# Patient Record
Sex: Male | Born: 1940 | Race: White | Hispanic: No | Smoking: Former smoker
Health system: Southern US, Community
[De-identification: ages and names within clinical notes are randomized; demographics above are authoritative.]

## PROBLEM LIST (undated history)

## (undated) DIAGNOSIS — K219 Gastro-esophageal reflux disease without esophagitis: Secondary | ICD-10-CM

## (undated) DIAGNOSIS — K5792 Diverticulitis of intestine, part unspecified, without perforation or abscess without bleeding: Secondary | ICD-10-CM

## (undated) DIAGNOSIS — K859 Acute pancreatitis without necrosis or infection, unspecified: Secondary | ICD-10-CM

## (undated) DIAGNOSIS — K449 Diaphragmatic hernia without obstruction or gangrene: Secondary | ICD-10-CM

## (undated) DIAGNOSIS — I10 Essential (primary) hypertension: Secondary | ICD-10-CM

## (undated) DIAGNOSIS — E785 Hyperlipidemia, unspecified: Secondary | ICD-10-CM

## (undated) DIAGNOSIS — N400 Enlarged prostate without lower urinary tract symptoms: Secondary | ICD-10-CM

## (undated) DIAGNOSIS — T7840XA Allergy, unspecified, initial encounter: Secondary | ICD-10-CM

## (undated) DIAGNOSIS — M1990 Unspecified osteoarthritis, unspecified site: Secondary | ICD-10-CM

## (undated) HISTORY — DX: Diverticulitis of intestine, part unspecified, without perforation or abscess without bleeding: K57.92

## (undated) HISTORY — DX: Essential (primary) hypertension: I10

## (undated) HISTORY — PX: COLONOSCOPY: SHX174

## (undated) HISTORY — PX: TRANSURETHRAL RESECTION OF PROSTATE: SHX73

## (undated) HISTORY — DX: Gastro-esophageal reflux disease without esophagitis: K21.9

## (undated) HISTORY — DX: Allergy, unspecified, initial encounter: T78.40XA

## (undated) HISTORY — DX: Benign prostatic hyperplasia without lower urinary tract symptoms: N40.0

## (undated) HISTORY — DX: Acute pancreatitis without necrosis or infection, unspecified: K85.90

## (undated) HISTORY — DX: Hyperlipidemia, unspecified: E78.5

## (undated) HISTORY — PX: PROSTATE SURGERY: SHX751

---

## 1998-09-13 ENCOUNTER — Emergency Department (HOSPITAL_COMMUNITY): Admission: EM | Admit: 1998-09-13 | Discharge: 1998-09-13 | Payer: Self-pay | Admitting: Emergency Medicine

## 1998-09-13 ENCOUNTER — Encounter: Payer: Self-pay | Admitting: Pulmonary Disease

## 1998-09-18 ENCOUNTER — Emergency Department (HOSPITAL_COMMUNITY): Admission: EM | Admit: 1998-09-18 | Discharge: 1998-09-18 | Payer: Self-pay | Admitting: Emergency Medicine

## 1998-09-26 ENCOUNTER — Encounter (INDEPENDENT_AMBULATORY_CARE_PROVIDER_SITE_OTHER): Payer: Self-pay | Admitting: Specialist

## 1998-09-26 ENCOUNTER — Inpatient Hospital Stay (HOSPITAL_COMMUNITY): Admission: RE | Admit: 1998-09-26 | Discharge: 1998-09-28 | Payer: Self-pay | Admitting: Urology

## 2000-04-23 ENCOUNTER — Ambulatory Visit (HOSPITAL_COMMUNITY): Admission: RE | Admit: 2000-04-23 | Discharge: 2000-04-23 | Payer: Self-pay | Admitting: Gastroenterology

## 2000-04-23 ENCOUNTER — Encounter (INDEPENDENT_AMBULATORY_CARE_PROVIDER_SITE_OTHER): Payer: Self-pay | Admitting: Specialist

## 2000-04-23 DIAGNOSIS — K210 Gastro-esophageal reflux disease with esophagitis: Secondary | ICD-10-CM

## 2004-07-27 ENCOUNTER — Ambulatory Visit: Payer: Self-pay | Admitting: Pulmonary Disease

## 2004-08-03 ENCOUNTER — Ambulatory Visit: Payer: Self-pay

## 2004-08-13 ENCOUNTER — Ambulatory Visit: Payer: Self-pay | Admitting: Pulmonary Disease

## 2005-08-23 ENCOUNTER — Ambulatory Visit: Payer: Self-pay | Admitting: Pulmonary Disease

## 2006-09-12 ENCOUNTER — Ambulatory Visit: Payer: Self-pay | Admitting: Pulmonary Disease

## 2006-11-14 ENCOUNTER — Ambulatory Visit: Payer: Self-pay | Admitting: Pulmonary Disease

## 2006-11-14 LAB — CONVERTED CEMR LAB
ALT: 27 units/L (ref 0–53)
AST: 29 units/L (ref 0–37)
Alkaline Phosphatase: 76 units/L (ref 39–117)
BUN: 14 mg/dL (ref 6–23)
Basophils Relative: 0.5 % (ref 0.0–1.0)
CO2: 31 meq/L (ref 19–32)
Calcium: 8.9 mg/dL (ref 8.4–10.5)
Creatinine, Ser: 0.9 mg/dL (ref 0.4–1.5)
HDL: 50.1 mg/dL (ref >39.0)
Hemoglobin: 14.7 g/dL (ref 13.0–17.0)
LDL Cholesterol: 87 mg/dL (ref 0–99)
Monocytes Absolute: 0.6 10*3/uL (ref 0.2–0.7)
Monocytes Relative: 9.8 % (ref 3.0–11.0)
Platelets: 209 10*3/uL (ref 150–400)
RDW: 12.1 % (ref 11.5–14.6)
Total Bilirubin: 0.9 mg/dL (ref 0.3–1.2)
Total Protein: 6.8 g/dL (ref 6.0–8.3)
VLDL: 14 mg/dL (ref 0–40)

## 2006-11-24 ENCOUNTER — Encounter: Payer: Self-pay | Admitting: Pulmonary Disease

## 2006-11-24 DIAGNOSIS — J309 Allergic rhinitis, unspecified: Secondary | ICD-10-CM | POA: Insufficient documentation

## 2006-11-24 DIAGNOSIS — M1990 Unspecified osteoarthritis, unspecified site: Secondary | ICD-10-CM | POA: Insufficient documentation

## 2006-11-24 DIAGNOSIS — E7800 Pure hypercholesterolemia, unspecified: Secondary | ICD-10-CM | POA: Insufficient documentation

## 2006-11-24 DIAGNOSIS — K449 Diaphragmatic hernia without obstruction or gangrene: Secondary | ICD-10-CM | POA: Insufficient documentation

## 2006-11-24 DIAGNOSIS — K5730 Diverticulosis of large intestine without perforation or abscess without bleeding: Secondary | ICD-10-CM | POA: Insufficient documentation

## 2006-11-24 DIAGNOSIS — Z87898 Personal history of other specified conditions: Secondary | ICD-10-CM | POA: Insufficient documentation

## 2006-11-24 DIAGNOSIS — D126 Benign neoplasm of colon, unspecified: Secondary | ICD-10-CM

## 2006-12-05 ENCOUNTER — Telehealth (INDEPENDENT_AMBULATORY_CARE_PROVIDER_SITE_OTHER): Payer: Self-pay | Admitting: *Deleted

## 2007-02-10 ENCOUNTER — Ambulatory Visit: Payer: Self-pay | Admitting: Pulmonary Disease

## 2007-02-10 DIAGNOSIS — R42 Dizziness and giddiness: Secondary | ICD-10-CM

## 2007-04-01 ENCOUNTER — Encounter: Payer: Self-pay | Admitting: Pulmonary Disease

## 2007-06-18 ENCOUNTER — Inpatient Hospital Stay (HOSPITAL_COMMUNITY): Admission: EM | Admit: 2007-06-18 | Discharge: 2007-06-25 | Payer: Self-pay | Admitting: Emergency Medicine

## 2007-06-18 ENCOUNTER — Ambulatory Visit: Payer: Self-pay | Admitting: Pulmonary Disease

## 2007-06-24 ENCOUNTER — Ambulatory Visit: Payer: Self-pay | Admitting: Internal Medicine

## 2007-07-03 ENCOUNTER — Encounter: Payer: Self-pay | Admitting: Pulmonary Disease

## 2007-07-06 ENCOUNTER — Ambulatory Visit: Payer: Self-pay | Admitting: Gastroenterology

## 2007-07-07 ENCOUNTER — Ambulatory Visit: Payer: Self-pay | Admitting: Gastroenterology

## 2007-07-16 ENCOUNTER — Telehealth: Payer: Self-pay | Admitting: Gastroenterology

## 2007-07-17 ENCOUNTER — Ambulatory Visit (HOSPITAL_COMMUNITY): Admission: RE | Admit: 2007-07-17 | Discharge: 2007-07-17 | Payer: Self-pay | Admitting: Gastroenterology

## 2007-08-03 ENCOUNTER — Ambulatory Visit: Payer: Self-pay | Admitting: Gastroenterology

## 2007-08-03 DIAGNOSIS — R933 Abnormal findings on diagnostic imaging of other parts of digestive tract: Secondary | ICD-10-CM | POA: Insufficient documentation

## 2007-10-06 ENCOUNTER — Telehealth: Payer: Self-pay | Admitting: Gastroenterology

## 2007-10-23 ENCOUNTER — Ambulatory Visit (HOSPITAL_COMMUNITY): Admission: RE | Admit: 2007-10-23 | Discharge: 2007-10-23 | Payer: Self-pay | Admitting: Gastroenterology

## 2007-10-23 ENCOUNTER — Encounter: Payer: Self-pay | Admitting: Gastroenterology

## 2007-11-06 ENCOUNTER — Encounter: Payer: Self-pay | Admitting: Pulmonary Disease

## 2007-12-10 ENCOUNTER — Ambulatory Visit: Payer: Self-pay | Admitting: Pulmonary Disease

## 2008-04-04 ENCOUNTER — Encounter: Payer: Self-pay | Admitting: Gastroenterology

## 2008-04-13 ENCOUNTER — Encounter: Payer: Self-pay | Admitting: Pulmonary Disease

## 2008-09-14 ENCOUNTER — Encounter: Payer: Self-pay | Admitting: Pulmonary Disease

## 2008-10-05 ENCOUNTER — Ambulatory Visit: Payer: Self-pay | Admitting: Pulmonary Disease

## 2009-03-15 ENCOUNTER — Telehealth (INDEPENDENT_AMBULATORY_CARE_PROVIDER_SITE_OTHER): Payer: Self-pay | Admitting: *Deleted

## 2009-03-22 ENCOUNTER — Encounter: Payer: Self-pay | Admitting: Pulmonary Disease

## 2009-04-07 ENCOUNTER — Telehealth (INDEPENDENT_AMBULATORY_CARE_PROVIDER_SITE_OTHER): Payer: Self-pay | Admitting: *Deleted

## 2009-04-12 ENCOUNTER — Encounter: Payer: Self-pay | Admitting: Pulmonary Disease

## 2009-06-28 ENCOUNTER — Ambulatory Visit: Payer: Self-pay | Admitting: Pulmonary Disease

## 2009-06-28 ENCOUNTER — Ambulatory Visit: Payer: Self-pay | Admitting: Internal Medicine

## 2009-06-28 DIAGNOSIS — R9431 Abnormal electrocardiogram [ECG] [EKG]: Secondary | ICD-10-CM

## 2009-06-29 DIAGNOSIS — M5030 Other cervical disc degeneration, unspecified cervical region: Secondary | ICD-10-CM | POA: Insufficient documentation

## 2009-06-29 DIAGNOSIS — K859 Acute pancreatitis without necrosis or infection, unspecified: Secondary | ICD-10-CM | POA: Insufficient documentation

## 2009-06-29 DIAGNOSIS — R972 Elevated prostate specific antigen [PSA]: Secondary | ICD-10-CM | POA: Insufficient documentation

## 2009-06-29 LAB — CONVERTED CEMR LAB
Albumin: 4.4 g/dL (ref 3.5–5.2)
Basophils Absolute: 0 10*3/uL (ref 0.0–0.1)
CO2: 27 meq/L (ref 19–32)
Calcium: 9.2 mg/dL (ref 8.4–10.5)
Creatinine, Ser: 0.9 mg/dL (ref 0.4–1.5)
Glucose, Bld: 108 mg/dL — ABNORMAL HIGH (ref 70–99)
Hemoglobin: 14.7 g/dL (ref 13.0–17.0)
Lymphocytes Relative: 30.5 % (ref 12.0–46.0)
Monocytes Relative: 9.1 % (ref 3.0–12.0)
Neutro Abs: 3.6 10*3/uL (ref 1.4–7.7)
Neutrophils Relative %: 58.6 % (ref 43.0–77.0)
PSA: 6.3 ng/mL — ABNORMAL HIGH (ref 0.10–4.00)
RBC: 4.82 M/uL (ref 4.22–5.81)
RDW: 12.9 % (ref 11.5–14.6)
Total Protein: 7.7 g/dL (ref 6.0–8.3)

## 2009-08-08 ENCOUNTER — Telehealth (INDEPENDENT_AMBULATORY_CARE_PROVIDER_SITE_OTHER): Payer: Self-pay | Admitting: *Deleted

## 2009-08-16 ENCOUNTER — Encounter: Payer: Self-pay | Admitting: Pulmonary Disease

## 2009-10-16 ENCOUNTER — Ambulatory Visit: Payer: Self-pay | Admitting: Pulmonary Disease

## 2009-10-28 ENCOUNTER — Emergency Department (HOSPITAL_BASED_OUTPATIENT_CLINIC_OR_DEPARTMENT_OTHER): Admission: EM | Admit: 2009-10-28 | Discharge: 2009-10-28 | Payer: Self-pay | Admitting: Emergency Medicine

## 2009-12-05 ENCOUNTER — Encounter: Payer: Self-pay | Admitting: Gastroenterology

## 2010-02-06 NOTE — Letter (Signed)
Summary: Screening/[facility]  Screening/[facility]   Imported By: Lester Trousdale 07/05/2009 08:32:30  _____________________________________________________________________  External Attachment:    Type:   Image     Comment:   External Document

## 2010-02-06 NOTE — Medication Information (Signed)
Summary: Refills for Omeprazole/[facility]  Refills for Omeprazole/[facility]   Imported By: Sherian Rein 12/11/2009 12:09:50  _____________________________________________________________________  External Attachment:    Type:   Image     Comment:   External Document

## 2010-02-06 NOTE — Progress Notes (Signed)
Summary: Records request from [employer]  Request for records received from [employer]. Request forwarded to [employer]. Dena Chavis  April 07, 2009 9:23 AM

## 2010-02-06 NOTE — Letter (Signed)
Summary: [facility]  [facility]   Imported By: Sherian Rein 08/31/2009 09:16:31  _____________________________________________________________________  External Attachment:    Type:   Image     Comment:   External Document

## 2010-02-06 NOTE — Letter (Signed)
Summary: [facility]  [facility]   Imported By: Lennie Odor 05/02/2009 11:25:06  _____________________________________________________________________  External Attachment:    Type:   Image     Comment:   External Document

## 2010-02-06 NOTE — Progress Notes (Signed)
Summary: Records request from [facility]  Request for records received from [employer].Request forwarded to [facility]. Dena Chavis  March 15, 2009 10:44 AM

## 2010-02-06 NOTE — Assessment & Plan Note (Signed)
Summary: flu shot/ mbw  Nurse Visit   Allergies: 1)  ! Percocet  Orders Added: 1)  Flu Vaccine 17yrs + MEDICARE PATIENTS [Q2039] 2)  Administration Flu vaccine - MCR [G0008] Flu Vaccine Consent Questions     Do you have a history of severe allergic reactions to this vaccine? no    Any prior history of allergic reactions to egg and/or gelatin? no    Do you have a sensitivity to the preservative Thimersol? no    Do you have a past history of Guillan-Barre Syndrome? no    Do you currently have an acute febrile illness? no    Have you ever had a severe reaction to latex? no    Vaccine information given and explained to patient? yes    Are you currently pregnant? no    Lot Number:[id number]   Exp Date:07/07/2010   Site Given  Left Deltoid [id number]    Tammy Scott  November 02, 2009 4:51 PM

## 2010-02-06 NOTE — Assessment & Plan Note (Signed)
Summary: PHYSICAL ///KP   Primary Care Provider:   Alroy Dust MD  CC:  last ov 2008---cpx--discuss psa level for life insurance.  History of Present Illness: 70 y/o WM here for a follow up visit & CPX...     ~  June 28, 2009:  I last saw Thomas Pearson 9/08 for a yearly ROV w/ mult medical problems as listed below & hx HH/ GERD on Nexium, and Hypercholesterolemia on Simva80... he was hospitalized by GI 6/09 w/ idiopathic pancreatitis> Abd Sonar & CT Abd showed inflamm/ edematous changes around the duod, pancreas, & retroperitoneum (there was a duod divertic, GB was OK)... he improved w/ conservative management;  outpt EGD by [facility] 6/09 showed 2cmHH & stricture in distal esoph...  MR Abd 7/09 showed improved pancreatitis/ resolved edema, +duod divertic, norm GB & ducts...  no recurrent problem since then- he knows to avoid alcohol...  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 he uses Tylenol for pain...  he had labs at insurance co 3/11 trying to get add'l life insurance & FLP looked poor off his Simva, and PSA was elevated at 5.52> so he set up this f/u appt.   Current Problems:   ALLERGIC RHINITIS (ICD-477.9) - he uses OTC antihist Prn + Nasal Saline...  ABNORMAL ELECTROCARDIOGRAM (ICD-794.31) - he he transient AFib reported in the ER w/ his acute pancreatitis 6/09> self limited & ret to NSR... otherw EKG's have been normal x PAC's noted intermittently & he is asymptomatic- denies CP, palpit, dizzy, etc...  ~  baseline EKGs showed NSR, WNL...  ~  Myoview 7/06 showed occas PAC/ PVC, hypertensive response, good exerc tolerance, diaph attenuation w/o definte scar, no ischemia...  ~  EKG 6/11 showed NSR w/ PAC, otherw WNL/ NAD...  HYPERCHOLESTEROLEMIA (ICD-272.0) - hx hypercholesterolemia prev on Simva80 but this was stopped 6/09 hosp for pancreatitis & pt never followed up... rec to start  CRESTOR 20mg /d + diet Rx...  ~  FLP 3/04 on diet alone showed TChol 304, TG 130, HDL 53, LDL 225... rec start Simva40.  ~  FLP 9/04 on Simva40 showed TChol 205, Tg 103, HDL 50, LDL 135... rec incr to Simva80.  ~  FLP 7/06 on diet alone showed TChol 283, TG 111, HDL 48, LDL 209... rec restart Simva80.  ~  FLP 11/08 on Simva80 showed TChol 151, TG 68, HDL 50, LDL 87  ~  FLP 3/11 by insurance co on diet alone showed TChol 263, TG 118, HDL 61, LDL 178  ~  6/11:  rec to start CRESTOR 20mg /d + low chol/ low fat diet...  HIATAL HERNIA (ICD-553.3) & ESOPHAGITIS, REFLUX (ICD-530.11) - on OMEPRAZOLE 20mg /d regularly...   ~  EGD 5/05 by [facility] showed HH, gastritis, & treated w/ Nexium 40mg /d...  ~  EGD 6/09 by DrKaplan showed 2cmHH, & esoph stricture...  DIVERTICULOSIS, COLON (ICD-562.10), & COLONIC POLYPS (ICD-211.3)  ~  colonoscopy 5/05 by [facility] showed divertics, diminutive colon polyps, hems...  Hx of ACUTE PANCREATITIS (ICD-577.0) - see 6/09 hospitalization and f/u by DrKaplan...  BENIGN PROSTATIC HYPERTROPHY, HX OF (ICD-V13.8), & ELEVATED PROSTATE SPECIFIC ANTIGEN (ICD-790.93) - long hx elevated PSA's w/ TURP 9/00 by [id number]- benign tissue... follow up yearly PSA's in the 4-5 range and prostate bx 9/06 was also benign...   ~  labs 3/11 by insurance co showed PSA= 5.52  ~  labs 6/11 here showed  PSA= 6.30 & we [facility]...  DEGENERATIVE JOINT DISEASE (ICD-715.90), & DEGENERATIVE DISC DISEASE, CERVICAL SPINE (ICD-722.4) - followed by DrDeveshwar for Rheum w/ signif DJD knees- s/p Synvisc shots & he's been told he'll need TKR's...  also hx chronic neck pain & DDD> he uses Tylenol, heat, etc...   Preventive Screening-Counseling & Management  Alcohol-Tobacco     Alcohol drinks/day: 0     Smoking Status: quit     Year Quit: 1967  Allergies: 1)  ! Percocet  Comments:  Nurse/Medical Assistant: The patient's medications and allergies were reviewed with  the patient and were updated in the Medication and Allergy Lists.  Past History:  Past Medical History: ALLERGIC RHINITIS (ICD-477.9) ABNORMAL ELECTROCARDIOGRAM (ICD-794.31) HYPERCHOLESTEROLEMIA (ICD-272.0) HIATAL HERNIA (ICD-553.3) ESOPHAGITIS, REFLUX (ICD-530.11) DIVERTICULOSIS, COLON (ICD-562.10) COLONIC POLYPS (ICD-211.3) Hx of ACUTE PANCREATITIS (ICD-577.0) BENIGN PROSTATIC HYPERTROPHY, HX OF (ICD-V13.8) ELEVATED PROSTATE SPECIFIC ANTIGEN (ICD-790.93) DEGENERATIVE JOINT DISEASE (ICD-715.90) DEGENERATIVE DISC DISEASE, CERVICAL SPINE (ICD-722.4)  Past Surgical History: S/P transurethral resection of prostate 9/00 & prostate bx's 9/06  Family History: Reviewed history from 08/03/2007 and no changes required. No FH of Colon Cancer Family History of Heart Disease:  Father Family History of Liver Disease/Cirrhosis: Son  Social History: Reviewed history from 08/03/2007 and no changes required. Patient is a former smoker. -stopped 41 years ago Alcohol Use - no Daily Caffeine Use 3-4 per day Illicit Drug Use - no Occupation: Retired  Review of Systems       The patient complains of joint pain, stiffness, and arthritis.  The patient denies fever, chills, sweats, anorexia, fatigue, weakness, malaise, weight loss, sleep disorder, blurring, diplopia, eye irritation, eye discharge, vision loss, eye pain, photophobia, earache, ear discharge, tinnitus, decreased hearing, nasal congestion, nosebleeds, sore throat, hoarseness, chest pain, palpitations, syncope, dyspnea on exertion, orthopnea, PND, peripheral edema, cough, dyspnea at rest, excessive sputum, hemoptysis, wheezing, pleurisy, nausea, vomiting, diarrhea, constipation, change in bowel habits, abdominal pain, melena, hematochezia, jaundice, gas/bloating, indigestion/heartburn, dysphagia, odynophagia, dysuria, hematuria, urinary frequency, urinary hesitancy, nocturia, incontinence, back pain, joint swelling, muscle cramps, muscle  weakness, sciatica, restless legs, leg pain at night, leg pain with exertion, rash, itching, dryness, suspicious lesions, paralysis, paresthesias, seizures, tremors, vertigo, transient blindness, frequent falls, frequent headaches, difficulty walking, depression, anxiety, memory loss, confusion, cold intolerance, heat intolerance, polydipsia, polyphagia, polyuria, unusual weight change, abnormal bruising, bleeding, enlarged lymph nodes, urticaria, allergic rash, hay fever, and recurrent infections.    Vital Signs:  Patient profile:   70 year old male Height:      72 inches Weight:      174.31 pounds BMI:     23.73 O2 Sat:      96 % on Room air Temp:     96.9 degrees F oral Pulse rate:   54 / minute BP sitting:   122 / 68  (left arm) Cuff size:   regular  Vitals Entered By: Randell Loop CMA (June 28, 2009 11:25 AM)  O2 Sat at Rest %:  96 O2 Flow:  Room air CC: last ov 2008---cpx--discuss psa level for life insurance Is Patient Diabetic? No Pain Assessment Patient in pain? no      Comments no changes in meds today   Physical Exam  Additional Exam:  WD, WN, 70 y/o WM in NAD... GENERAL:  Alert & oriented; pleasant & cooperative. HEENT:  [place]/AT, EOM-wnl, PERRLA, Fundi-benign, EACs-clear, TMs-wnl, NOSE-clear, THROAT-clear & wnl. NECK:  Supple w/ fairROM; no JVD; normal carotid impulses w/o  bruits; no thyromegaly or nodules palpated; no lymphadenopathy. CHEST:  Clear to P & A; without wheezes/ rales/ or rhonchi. HEART:  Regular Rhythm; without murmurs/ rubs/ or gallops detected... ABDOMEN:  Soft & nontender; normal bowel sounds; no organomegaly or masses palpated... EXT: without deformities, +arthritic changes; no varicose veins/ venous insuffic/ or edema. NEURO:  CN's intact; motor testing normal; sensory testing normal; gait normal & balance OK. DERM:  No lesions noted; no rash etc...    CXR  Procedure date:  06/28/2009  Findings:      CHEST - 2 VIEW Comparison:  06/18/2007   Findings: Small midline heart with hyperaerated lungs as before. No active disease or interval change.  Osseous structures intact.   IMPRESSION: COPD - no active disease.   Read By:  Bernerd Limbo,  M.D.   EKG  Procedure date:  06/28/2009  Findings:      Normal sinus rhythm with rate of:  60/ min... Tracing shows one PAC, otherw WNL, NAD...  SN   MISC. Report  Procedure date:  06/28/2009  Findings:      BMP (METABOL)   Sodium                    142 mEq/L                   135-145   Potassium                 4.2 mEq/L                   3.5-5.1   Chloride                  108 mEq/L                   96-112   Carbon Dioxide            27 mEq/L                    19-32   Glucose              [H]  108 mg/dL                   91-47   BUN                       19 mg/dL                    8-29   Creatinine                0.9 mg/dL                   5.6-2.1   Calcium                   9.2 mg/dL                   3.0-86.5   GFR                       92.03 mL/min                >60  Hepatic/Liver Function Panel (HEPATIC)   Total Bilirubin           1.0 mg/dL  0.3-1.2   Direct Bilirubin          0.1 mg/dL                   1.1-9.1   Alkaline Phosphatase      86 U/L                      39-117   AST                       22 U/L                      0-37   ALT                       16 U/L                      0-53   Total Protein             7.7 g/dL                    4.7-8.2   Albumin                   4.4 g/dL                    9.5-6.2  CBC Platelet w/Diff (CBCD)   White Cell Count          6.1 K/uL                    4.5-10.5   Red Cell Count            4.82 Mil/uL                 4.22-5.81   Hemoglobin                14.7 g/dL                   13.0-86.5   Hematocrit                43.1 %                      39.0-52.0   MCV                       89.4 fl                     78.0-100.0   Platelet Count            194.0 K/uL                   150.0-400.0   Neutrophil %              58.6 %                      43.0-77.0   Lymphocyte %              30.5 %                      12.0-46.0   Monocyte %                9.1 %  3.0-12.0   Eosinophils%              1.2 %                       0.0-5.0   Basophils %               0.6 %                       0.0-3.0   TSH (TSH)   FastTSH                   1.34 uIU/mL                 0.35-5.50  Prostate Specific Antigen (PSA)   PSA-Hyb              [H]  6.30    Impression & Recommendations:  Problem # 1:  PHYSICAL EXAMINATION (ICD-V70.0) He had FLP & additional labs from [facility] 03/22/09 & these were reviewed w/ pt... Orders: 12 Lead EKG (12 Lead EKG) TLB-BMP (Basic Metabolic Panel-BMET) (80048-METABOL) TLB-Hepatic/Liver Function Pnl (80076-HEPATIC) TLB-CBC Platelet - w/Differential (85025-CBCD) TLB-TSH (Thyroid Stimulating Hormone) (84443-TSH) TLB-PSA (Prostate Specific Antigen) (84153-PSA) T-2 View CXR (71020TC)  Problem # 2:  ABNORMAL ELECTROCARDIOGRAM (ICD-794.31) He had transient PAF in ER 6/09 w/ his acute pancreatitis, converted spont... otherw asymptomatic w/ rare PAC's on EKG... cards eval 7/06 for mult risk factors= neg Myoview w/o ischemia... we reviewed risks and need to modify in his favor...  Problem # 3:  HYPERCHOLESTEROLEMIA (ICD-272.0) He will start CRESTOR 20mg /d & f/u FLP in 3 months... His updated medication list for this problem includes:    Crestor 20 Mg Tabs (Rosuvastatin calcium) .[name] Kitchen... Take 1 tab by mouth once daily for cholesterol...  Problem # 4:  Hx of ACUTE PANCREATITIS (ICD-577.0) GI is stable w/o recurrent problems... continue OMEP 20mg /d...  Problem # 5:  ELEVATED PROSTATE SPECIFIC ANTIGEN (ICD-790.93) We will refer to Urology for repeat eval w/ increasing PSA...  Problem # 6:  DEGENERATIVE JOINT DISEASE (ICD-715.90) Followed by Rober Minion for Rheum...  Problem # 7:  OTHER MEDICAL PROBLEMS AS  NOTED>>>  Complete Medication List: 1)  Adult Aspirin Low Strength 81 Mg Tbdp (Aspirin) .... Take 1 tablet by mouth once a day 2)  Crestor 20 Mg Tabs (Rosuvastatin calcium) .... Take 1 tab by mouth once daily for cholesterol... 3)  Omeprazole 20 Mg Cpdr (Omeprazole) .[name] Kitchen.. 1 each day 30 minutes before meal  Other Orders: Urology Referral (Urology)  Patient Instructions: 1)  Today we updated your med list- see below.... 2)  We wrote a new perscription for CRESTOR 20mg  to take one tab daily for your cholesterol...  3)  Let's plan a f/u Lipid Profile after you have been on this med for 3-4 months (call us when you are ready to repeat the lab)... 4)  Today we did your CXR, EKG, & the rest of your needed blood work... please call the "phone tree" in a few days for your lab results.[name] KitchenMarland Kitchen 5)  We will arrange for an appt w/ the Urologist to check your prostate in light of the elevated PSA blood test... 6)  Call for any problems... Prescriptions: CRESTOR 20 MG TABS (ROSUVASTATIN CALCIUM) take 1 tab by mouth once daily for cholesterol...  #30 x 11   Entered and Authorized by:   Michele Mcalpine MD   Signed by:   Michele Mcalpine MD  on 06/28/2009   Method used:   Electronically to        [facility]* (retail)       [address] [place], [place]  [address]       Ph: [phone] or [phone]       Fax: [phone]   RxID:   [id number]    [facility]io[employer] ECG  ID: [id number] Patient: Thomas Pearson, Thomas Pearson DOB: Aug 07, 1940 Age: 70 Years Old Sex: Male Race: White Physician: Darey Hershberger Technician: Randell Loop CMA Height: 72 Weight: 174.31 Status: Unconfirmed Past Medical History:  DEGENERATIVE JOINT DISEASE (ICD-715.90) HYPERCHOLESTEROLEMIA (ICD-272.0) BENIGN PROSTATIC HYPERTROPHY, HX OF (ICD-V13.8) DIVERTICULOSIS, COLON (ICD-562.10) COLONIC POLYPS (ICD-211.3) HIATAL HERNIA (ICD-553.3) ALLERGIC RHINITIS (ICD-477.9) nausea with  vomiting generalized abdominal pain  Recorded: 06/28/2009 11:37 AM P/PR: 128 ms / 142 ms - Heart rate (maximum exercise) QRS: 96 QT/QTc/QTd: 410 ms / 408 ms / 41 ms - Heart rate (maximum exercise)  P/QRS/T axis: -47 deg / 75 deg / 64 deg - Heart rate (maximum exercise)  Heartrate: 59 bpm  Interpretation:  Normal sinus rhythm with rate of:  60/ min... Tracing shows one PAC, otherw WNL, NAD...  SN

## 2010-02-06 NOTE — Progress Notes (Signed)
Summary: psa Values  Phone Note From Other Clinic   Caller: [facility] Call For: nadel Summary of Call: need all pt's PSA values faxed asap Fax# [phone] Initial call taken by: Eugene Gavia,  August 08, 2009 10:39 AM  Follow-up for Phone Call        Faxed notes.//Juanita Follow-up by: Darletta Moll,  August 09, 2009 10:26 AM

## 2010-03-06 ENCOUNTER — Encounter: Payer: Self-pay | Admitting: Pulmonary Disease

## 2010-03-20 NOTE — Letter (Signed)
Summary: [facility]  [facility]   Imported By: Sherian Rein 03/13/2010 15:12:02  _____________________________________________________________________  External Attachment:    Type:   Image     Comment:   External Document

## 2010-05-22 NOTE — Discharge Summary (Signed)
NAME:  Thomas Pearson, Thomas Pearson NO.:  [id number]   MEDICAL RECORD NO.:  [id number]          PATIENT TYPE:  INP   LOCATION:  1420                         FACILITY[facility]  [facility]   PHYSICIAN:  Iva Boop, MD,FACGDATE OF BIRTH:  [birth year]   DATE OF ADMISSION:  06/18/2007  DATE OF DISCHARGE:  06/25/2007                               DISCHARGE SUMMARY   ADMISSION DIAGNOSES:  61. A 70 year old white male with acute pancreatitis, etiology not      clear, rule out  contained perforated duodenal ulcer versus      biliary, versus medication induced.  2. Gastroesophageal reflux disease.  3. Hyperlipidemia.  4. History of colon polyps.  5. Transient atrial fibrillation secondary to acute illness.  6. Ileus secondary to pancreatitis, resolved.   CONSULTATIONS:  Admitted to Dr. Jodelle Green service and transferred to GI  service/Dr. Leone Payor.   PROCEDURE:  CT scan abdomen and pelvis.   BRIEF HISTORY:  Dany is a pleasant 70 year old white male a primary  patient of Dr. Jodelle Green previously known to Dr. Kinnie Scales from a GI  standpoint, who presented to the emergency room on June 18, 2007 with  complaints of acute severe abdominal pain which occurred last evening,  onset after eating dinner.  In retrospect, the patient stated he had  been having some mild epigastric discomfort over the past few weeks, but  had not been having any other associated symptoms.  He is on daily PPI  for chronic GERD, he takes 1 baby aspirin per day and no NSAIDs.  Denies  any ETOH.  The patient says that onset, his pain was severe, radiated to  his back and was associated with nausea and vomiting.  He did not have  any hematemesis.  He has not had any melena.  No documented fever or  chills.  His pain persisted through this morning and he presented to the  emergency room.  Workup at this point was CT scan of the abdomen and  pelvis.  It shows evidence of acute pancreatitis and findings also  concerning for possible  penetrating duodenal ulcer.  At the time of  admission, his pain is somewhat improved.  Pain level has gone from a 10  to a 6.  He remained hemodynamically stable at this time.  He did have a  run of atrial fibrillation last evening which converted to normal sinus  rhythm and this has persisted.  He is admitted at this time for  supportive management and further diagnostic workup.   LABORATORY DATA:  On admission:  WBC of 12.5, hemoglobin 15.9,  hematocrit of 46.5, MCV of 88, platelets 198.  Serial values were  obtained on June 23, 2007:  WBC of 12.6, hemoglobin 13.1, hematocrit of  37.9, MCV of 87.6, platelets 244, sed rate was 20.  Electrolytes within  normal limits.  Glucose 188 on admission.  Follow up on June 23, 2007  was 116 and GFR greater than 60, albumin 4.1 on admission.  Liver  function studies normal.  Lipase greater than 2000, lipase on June 19, 2007 was 386, amylase 915.  By June 21, 2007 , lipase was 45.  Amylase  was 149.  Lipid panel:  Cholesterol 118, triglycerides 60, HDL 48, LDL  is 58, TSH was 1.31, PSA of 3.59.  UA showed ketones positive at 40,  protein positive at 100.   DIAGNOSTICS:  1. EKG on admission shows a normal sinus rhythm  2. CT scan of the abdomen and pelvis on June 18, 2007 shows  inflammatory/edematous changes around the proximal duodenum, pancreas  and retroperitoneum suggesting duodenal ulcer disease, rule out possible  contained perforation versus acute pancreatitis.  No drainable fluid  collection.  1. Abdominal ultrasound on June 18, 2007:  Normal appearing      gallbladder.  No ductal dilation.  Edematous changes of the      pancreatic head.  2. Chest x-ray on June 18, 2007:  No pleural effusion or pulmonary      edema.  3. Follow up CT scan on June 22, 2007 shows features consistent with      acute pancreatitis.  No evidence of pancreatic necrosis.  No      pseudocyst.  There is an apparent duodenal diverticulum near the      junction of  the first and second portions of the duodenum      containing air and contrast, but no free spill of contrast evident.      Therefore this is felt more likely to represent a diverticulum,      though contained perforation could not completely be ruled out.   HOSPITAL COURSE:  The patient was admitted to Dr. Jodelle Green service and  then cared for by GI, and transferred to the GI service with acute  pancreatitis.  He was initially kept n.p.o., placed on telemetry because  of a transient run of atrial fibrillation in the emergency room.  He was  hydrated, given antiemetics, analgesics and a kept n.p.o.  Initial CT  scan as outlined above, raising the possibility of a  contained/perforation of a duodenal ulcer.  He clearly had chemical and  radiographic evidence for acute pancreatitis.  He gradually improved  over the next few days.  He did develop an ileus which slowed his  progress and this was resolving by June 24, 2007 and his diet was  advanced.  He did not have any evidence for biliary etiology of his  pancreatitis.  He had been on Zocor at home and this was considered  possibly the culprit.  He was continued off of Zocor.   DISPOSITION:  By June 25, 2007, he was stable for discharge to home.   FOLLOW UP:  He was to follow up with Dr. Arlyce Dice in the office in 2 weeks  and at that time would likely need an MRI and plus/minus MRCP.   DISCHARGE MEDICATIONS:  1. He was to stay off of Zocor.  2. He was to continue his aspirin 81 mg daily.  3. Prilosec 20 mg daily.  4. B12 supplement weekly as previous.  5. MiraLax 17 gm daily as needed for constipation.  6. Percocet 5/325 one-two every 4-6 hours as needed for pain.   DIET:  Low fat.   CONDITION ON DISCHARGE:  Stable and improved.      Amy Esterwood, PA-C      Iva Boop, MD,FACG  Electronically Signed    AE/MEDQ  D:  07/28/2007  T:  07/28/2007  Job:  [id number]   cc:   Lonzo Cloud. Kriste Basque, MD  [address] [facility]  [place]  [place] [address]   Barbette Hair. Arlyce Dice, MD,FACG  [address]  [place]  [place] [address]

## 2010-07-25 ENCOUNTER — Other Ambulatory Visit: Payer: Self-pay | Admitting: *Deleted

## 2010-07-25 ENCOUNTER — Other Ambulatory Visit: Payer: Self-pay | Admitting: Pulmonary Disease

## 2010-07-25 MED ORDER — ROSUVASTATIN CALCIUM 20 MG PO TABS: 20.0000 mg | ORAL_TABLET | Freq: Every day | ORAL | Status: DC

## 2010-07-25 NOTE — Telephone Encounter (Signed)
1 refill sent for Crestor. Pt will need Ov for further refills. Last seen in June 2011.

## 2010-08-22 ENCOUNTER — Telehealth: Payer: Self-pay | Admitting: Gastroenterology

## 2010-08-22 NOTE — Telephone Encounter (Signed)
Pts son is calling wanting to know if pt needs to have any procedures done. Pt had an EGD and colon done in 2002 with Dr. Kinnie Scales. Pt also had and egd in 2009 with Dr. Arlyce Dice. Please advise.

## 2010-08-23 NOTE — Telephone Encounter (Signed)
Pt scheduled for OV with Dr. Arlyce Dice 10/01/10@3 :45pm. Pts son aware of appt date and time.

## 2010-08-23 NOTE — Telephone Encounter (Signed)
Needs OV.  

## 2010-08-29 ENCOUNTER — Other Ambulatory Visit: Payer: Self-pay | Admitting: *Deleted

## 2010-08-29 MED ORDER — ROSUVASTATIN CALCIUM 20 MG PO TABS: 20.0000 mg | ORAL_TABLET | Freq: Every day | ORAL | Status: DC

## 2010-09-13 ENCOUNTER — Emergency Department (HOSPITAL_COMMUNITY): Payer: Medicare Other

## 2010-09-13 ENCOUNTER — Emergency Department (HOSPITAL_COMMUNITY)
Admission: EM | Admit: 2010-09-13 | Discharge: 2010-09-14 | Disposition: A | Discharge status: Home / Self Care | Payer: Medicare Other | Attending: Emergency Medicine | Admitting: Emergency Medicine

## 2010-09-13 ENCOUNTER — Telehealth: Payer: Self-pay | Admitting: Gastroenterology

## 2010-09-13 DIAGNOSIS — K5732 Diverticulitis of large intestine without perforation or abscess without bleeding: Secondary | ICD-10-CM | POA: Insufficient documentation

## 2010-09-13 DIAGNOSIS — R109 Unspecified abdominal pain: Secondary | ICD-10-CM | POA: Insufficient documentation

## 2010-09-13 DIAGNOSIS — Z7982 Long term (current) use of aspirin: Secondary | ICD-10-CM | POA: Insufficient documentation

## 2010-09-13 DIAGNOSIS — E7800 Pure hypercholesterolemia, unspecified: Secondary | ICD-10-CM | POA: Insufficient documentation

## 2010-09-13 LAB — DIFFERENTIAL
Basophils Absolute: 0 10*3/uL (ref 0.0–0.1)
Basophils Relative: 0 % (ref 0–1)
Eosinophils Absolute: 0.1 10*3/uL (ref 0.0–0.7)
Eosinophils Relative: 1 % (ref 0–5)
Lymphocytes Relative: 24 % (ref 12–46)
Monocytes Absolute: 0.9 10*3/uL (ref 0.1–1.0)

## 2010-09-13 LAB — URINALYSIS, ROUTINE W REFLEX MICROSCOPIC
Glucose, UA: NEGATIVE mg/dL
Leukocytes, UA: NEGATIVE
Nitrite: NEGATIVE
Protein, ur: 30 mg/dL — AB
Urobilinogen, UA: 0.2 mg/dL (ref 0.0–1.0)

## 2010-09-13 LAB — URINE MICROSCOPIC-ADD ON

## 2010-09-13 LAB — CBC
MCHC: 33.9 g/dL (ref 30.0–36.0)
Platelets: 208 10*3/uL (ref 150–400)
RDW: 12.5 % (ref 11.5–15.5)
WBC: 8.6 10*3/uL (ref 4.0–10.5)

## 2010-09-13 LAB — COMPREHENSIVE METABOLIC PANEL
AST: 25 U/L (ref 0–37)
Albumin: 4 g/dL (ref 3.5–5.2)
Alkaline Phosphatase: 101 U/L (ref 39–117)
Chloride: 102 mEq/L (ref 96–112)
Potassium: 3.7 mEq/L (ref 3.5–5.1)
Sodium: 139 mEq/L (ref 135–145)
Total Bilirubin: 0.4 mg/dL (ref 0.3–1.2)
Total Protein: 7.7 g/dL (ref 6.0–8.3)

## 2010-09-13 MED ORDER — IOHEXOL 300 MG/ML  SOLN
100.0000 mL | Freq: Once | INTRAMUSCULAR | Status: AC | PRN
  Administered 2010-09-13: 100 mL via INTRAVENOUS

## 2010-09-13 NOTE — Telephone Encounter (Signed)
Pts son states that his father is doubled over with abdominal pain right below his belly button. States that he is unable to keep any liquids or food down. Feels he may be dehydrated. Instructed son to take the pt to the ER to be evaluated. Son verbalized understanding.

## 2010-09-14 ENCOUNTER — Telehealth: Payer: Self-pay | Admitting: Gastroenterology

## 2010-09-14 LAB — URINE CULTURE
Colony Count: NO GROWTH
Culture  Setup Time: 201209070153
Culture: NO GROWTH

## 2010-09-14 NOTE — Telephone Encounter (Signed)
Pt scheduled to see Dr. Arlyce Dice 09/18/10@1 :30pm. Message left for pt regarding appt date and time.

## 2010-09-18 ENCOUNTER — Encounter: Payer: Self-pay | Admitting: Gastroenterology

## 2010-09-18 ENCOUNTER — Ambulatory Visit (INDEPENDENT_AMBULATORY_CARE_PROVIDER_SITE_OTHER): Payer: Medicare Other | Admitting: Gastroenterology

## 2010-09-18 DIAGNOSIS — Z860100 Personal history of colon polyps, unspecified: Secondary | ICD-10-CM | POA: Insufficient documentation

## 2010-09-18 DIAGNOSIS — K5732 Diverticulitis of large intestine without perforation or abscess without bleeding: Secondary | ICD-10-CM

## 2010-09-18 MED ORDER — PEG-KCL-NACL-NASULF-NA ASC-C 100 G PO SOLR: 1.0000 | Freq: Once | ORAL | Status: DC

## 2010-09-18 NOTE — Assessment & Plan Note (Signed)
Symptoms suggest acute diverticulitis which has clinically resolved.

## 2010-09-18 NOTE — Progress Notes (Signed)
History of Present Illness:  Mr. Thomas Pearson is a pleasant 70 year old white male with history of idiopathic pancreatitis referred from the ER after he was evaluated 5 days ago for nausea vomiting and lower abdominal pain. CT scan, which I reviewed, showed mild thickening of the sigmoid colon and possible mild pericolonic fat stranding.  Lab work was pertinent for normal CBC, lipase and LFTs. He was sent home on Cipro and Flagyl. Symptoms have subsequently subsided. He is now without pain. He initially had diarrhea which has subsided.  He was hospitalized with acute pancreatitis in 2008. Triglycerides were elevated which was felt to be the possible cause. Adenomatous polyps were removed in 2002 on routine exam.    Review of Systems: Pertinent positive and negative review of systems were noted in the above HPI section. All other review of systems were otherwise negative.    Current Medications, Allergies, Past Medical History, Past Surgical History, Family History and Social History were reviewed in [employer] electronic medical record  Vital signs were reviewed in today's medical record. Physical Exam: General: Well developed , well nourished, no acute distress Head: Normocephalic and atraumatic Eyes:  sclerae anicteric, EOMI Ears: Normal auditory acuity Mouth: No deformity or lesions Lungs: Clear throughout to auscultation Heart: Regular rate and rhythm; no murmurs, rubs or bruits Abdomen: Soft, non tender and non distended. No masses, hepatosplenomegaly or hernias noted. Normal Bowel sounds Rectal:deferred Musculoskeletal: Symmetrical with no gross deformities  Pulses:  Normal pulses noted Extremities: No clubbing, cyanosis, edema or deformities noted Neurological: Alert oriented x 4, grossly nonfocal Psychological:  Alert and cooperative. Normal mood and affect

## 2010-09-18 NOTE — Patient Instructions (Signed)
Colonoscopy A colonoscopy is an exam to evaluate your entire colon. In this exam, your colon is cleansed. A long fiberoptic tube is inserted through your rectum and into your colon. The fiberoptic scope (endoscope) is a long bundle of enclosed and very flexible fibers. These fibers transmit light to the area examined and send images from that area to your caregiver. Discomfort is usually minimal. You may be given a drug to help you sleep (sedative) during or prior to the procedure. This exam helps to detect lumps (tumors), polyps, inflammation, and areas of bleeding. Your caregiver may also take a small piece of tissue (biopsy) that will be examined under a microscope. BEFORE THE PROCEDURE  A clear liquid diet may be required for 2 days before the exam.   Liquid injections (enemas) or laxatives may be required.   A large amount of electrolyte solution may be given to you to drink over a short period of time. This solution is used to clean out your colon.   You should be present 1 prior to your procedure or as directed by your caregiver.   Check in at the admissions desk to fill out necessary forms if not preregistered. There will be consent forms to sign prior to the procedure. If accompanied by friends or family, there is a waiting area for them while you are having your procedure.  LET YOUR CAREGIVER KNOW ABOUT:  Allergies to food or medicine.  Medicines taken, including vitamins, herbs, eyedrops, over-the-counter medicines, and creams.   Use of steroids (by mouth or creams).   Previous problems with anesthetics or numbing medicines.   History of bleeding problems or blood clots.  Previous surgery.   Other health problems, including diabetes and kidney problems.   Possibility of pregnancy, if this applies.   AFTER THE PROCEDURE  If you received a sedative and/or pain medicine, you will need to arrange for someone to drive you home.   Occasionally, there is a little blood passed  with the first bowel movement. DO NOT be concerned.  HOME CARE INSTRUCTIONS  It is not unusual to pass moderate amounts of gas and experience mild abdominal cramping following the procedure. This is due to air being used to inflate your colon during the exam. Walking or a warm pack on your belly (abdomen) may help.   You may resume all normal meals and activities after sedatives and medicines have worn off.   Only take over-the-counter or prescription medicines for pain, discomfort, or fever as directed by your caregiver. DO NOT use aspirin or blood thinners if a biopsy was taken. Consult your caregiver for medicine usage if biopsies were taken.  FINDING OUT THE RESULTS OF YOUR TEST Not all test results are available during your visit. If your test results are not back during the visit, make an appointment with your caregiver to find out the results. Do not assume everything is normal if you have not heard from your caregiver or the medical facility. It is important for you to follow up on all of your test results. SEEK IMMEDIATE MEDICAL CARE IF:  You pass large blood clots or fill a toilet with blood following the procedure. This may also occur 10 to 14 days following the procedure. This is more likely if a biopsy was taken.   You develop abdominal pain that keeps getting worse and cannot be relieved with medicine.  Document Released: 12/22/1999 Document Re-Released: 03/20/2009 [facility] Patient Information 2011 [place], [place]. Your Colonoscopy is scheduled on 09/27/2010 at  11am You can pick up your MoviPrep from your pharmacy today

## 2010-09-18 NOTE — Assessment & Plan Note (Signed)
Plan a followup colonoscopy in 2-3 weeks

## 2010-09-27 ENCOUNTER — Encounter: Payer: Self-pay | Admitting: Gastroenterology

## 2010-09-27 ENCOUNTER — Ambulatory Visit (AMBULATORY_SURGERY_CENTER): Payer: Medicare Other | Admitting: Gastroenterology

## 2010-09-27 DIAGNOSIS — D126 Benign neoplasm of colon, unspecified: Secondary | ICD-10-CM

## 2010-09-27 DIAGNOSIS — K5730 Diverticulosis of large intestine without perforation or abscess without bleeding: Secondary | ICD-10-CM

## 2010-09-27 DIAGNOSIS — K5732 Diverticulitis of large intestine without perforation or abscess without bleeding: Secondary | ICD-10-CM

## 2010-09-27 DIAGNOSIS — Z86010 Personal history of colonic polyps: Secondary | ICD-10-CM

## 2010-09-27 MED ORDER — SODIUM CHLORIDE 0.9 % IV SOLN: 500.0000 mL | INTRAVENOUS | Status: DC

## 2010-09-27 NOTE — Patient Instructions (Addendum)
Diverticulosis Diverticulosis is a common condition that develops when small pouches (diverticula) form in the wall of the colon. The risk of diverticulosis increases with age. It happens more often in people who eat a low-fiber diet. Most individuals with diverticulosis have no symptoms. Those individuals with symptoms usually experience belly (abdominal) pain, constipation, or loose stools (diarrhea). HOME CARE INSTRUCTIONS  Increase the amount of fiber in your diet as directed by your caregiver or dietician. This may reduce symptoms of diverticulosis.   Your caregiver may recommend taking a dietary fiber supplement.   Drink at least 6 to 8 glasses of water each day to prevent constipation.   Try not to strain when you have a bowel movement.   Your caregiver may recommend avoiding nuts and seeds to prevent complications, although this is still an uncertain benefit.   Only take over-the-counter or prescription medicines for pain, discomfort, or fever as directed by your caregiver.  FOODS HAVING HIGH FIBER CONTENT INCLUDE:  Fruits. Apple, peach, pear, tangerine, raisins, prunes.   Vegetables. Brussels sprouts, asparagus, broccoli, cabbage, carrot, cauliflower, romaine lettuce, spinach, summer squash, tomato, winter squash, zucchini.   Starchy Vegetables. Baked beans, kidney beans, lima beans, split peas, lentils, potatoes (with skin).   Grains. Whole wheat bread, brown rice, bran flake cereal, plain oatmeal, white rice, shredded wheat, bran muffins.  SEEK IMMEDIATE MEDICAL CARE IF:  You develop increasing pain or severe bloating.  You have an oral temperature above 100  Polyps, Colon  A polyp is extra tissue that grows inside your body. Colon polyps grow in the large intestine. The large intestine, also called the colon, is part of your digestive system. It is a long, hollow tube at the end of your digestive tract where your body makes and stores stool. Most polyps are not dangerous.  They are benign. This means they are not cancerous. But over time, some types of polyps can turn into cancer. Polyps that are smaller than a pea are usually not harmful. But larger polyps could someday become or may already be cancerous. To be safe, doctors remove all polyps and test them.  WHO GETS POLYPS? Anyone can get polyps, but certain people are more likely than others. You may have a greater chance of getting polyps if: You are over 50.  You have had polyps before.  Someone in your family has had polyps.  Someone in your family has had cancer of the large intestine.  Find out if someone in your family has had polyps. You may also be more likely to get polyps if you:  Eat a lot of fatty foods  Smoke  Drink alcohol  Do not exercise Eat too much  SYMPTOMS Most small polyps do not cause symptoms. People often do not know they have one until their caregiver finds it during a regular checkup or while testing them for something else. Some people do have symptoms like these: Bleeding from the anus. You might notice blood on your underwear or on toilet paper after you have had a bowel movement.  Constipation or diarrhea that lasts more than a week.  Blood in the stool. Blood can make stool look black or it can show up as red streaks in the stool.  If you have any of these symptoms, see your caregiver. HOW DOES THE DOCTOR TEST FOR POLYPS? The doctor can use four tests to check for polyps: Digital rectal exam. The caregiver wears gloves and checks your rectum (the last part of the large intestine)  to see if it feels normal. This test would find polyps only in the rectum. Your caregiver may need to do one of the other tests listed below to find polyps higher up in the intestine.  Barium enema. The caregiver puts a liquid called barium into your rectum before taking x-rays of your large intestine. Barium makes your intestine look white in the pictures. Polyps are dark, so they are easy to see.    Sigmoidoscopy. With this test, the caregiver can see inside your large intestine. A thin flexible tube is placed into your rectum. The device is called a sigmoidoscope, which has a light and a tiny video camera in it. The caregiver uses the sigmoidoscope to look at the last third of your large intestine.  Colonoscopy. This test is like sigmoidoscopy, but the caregiver looks at all of the large intestine. It usually requires sedation. This is the most common method for finding and removing polyps.  TREATMENT The caregiver will remove the polyp during sigmoidoscopy or colonoscopy. The polyp is then tested for cancer.  If you have had polyps, your caregiver may want you to get tested regularly in the future.  PREVENTION There is not one sure way to prevent polyps. You might be able to lower your risk of getting them if you: Eat more fruits and vegetables and less fatty food.  Do not smoke.  Avoid alcohol.  Exercise every day.  Lose weight if you are overweight.  Eating more calcium and folate can also lower your risk of getting polyps. Some foods that are rich in calcium are milk, cheese, and broccoli. Some foods that are rich in folate are chickpeas, kidney beans, and spinach.  Aspirin might help prevent polyps. Studies are under way.  Document Released: 09/20/2003 Document Re-Released: 06/13/2009  [facility] Patient Information 2011 [place], [place]., not controlled by medicine.   You develop vomiting or bowel movements that are bloody or black.  Document Released: 09/21/2003 Document Re-Released: 06/13/2009 [facility] Patient Information 2011 [place], [place].   FOLLOW DISCHARGE INSTRUCTIONS (BLUE & GREEN SHEETS)

## 2010-09-28 ENCOUNTER — Telehealth: Payer: Self-pay | Admitting: *Deleted

## 2010-09-28 NOTE — Telephone Encounter (Signed)

## 2010-09-28 NOTE — Telephone Encounter (Signed)
Disregard previous telephone note, 09/28/10. No answer at home number, message left for the patient.

## 2010-10-01 ENCOUNTER — Ambulatory Visit: Payer: Self-pay | Admitting: Gastroenterology

## 2010-10-04 LAB — CBC
HCT: 39.7
Hemoglobin: 13.6
MCHC: 34.2
MCHC: 34.2
MCHC: 34.5
MCV: 87.5
MCV: 88.5
Platelets: 170
Platelets: 244
RBC: 4.32
RBC: 4.32
RBC: 4.52
RBC: 5.29
RDW: 12.5
WBC: 12.5 — ABNORMAL HIGH
WBC: 13 — ABNORMAL HIGH

## 2010-10-04 LAB — COMPREHENSIVE METABOLIC PANEL
ALT: 12
ALT: 14
ALT: 22
AST: 17
AST: 28
Alkaline Phosphatase: 59
Alkaline Phosphatase: 63
BUN: 16
CO2: 26
CO2: 26
CO2: 29
Calcium: 8.3 — ABNORMAL LOW
Chloride: 102
Chloride: 97
Creatinine, Ser: 0.91
GFR calc Af Amer: >60
GFR calc Af Amer: >60
GFR calc non Af Amer: >60
GFR calc non Af Amer: >60
GFR calc non Af Amer: >60
Glucose, Bld: 153 — ABNORMAL HIGH
Potassium: 3.9
Potassium: 3.9
Sodium: 132 — ABNORMAL LOW
Sodium: 134 — ABNORMAL LOW
Sodium: 138
Total Bilirubin: 0.9
Total Bilirubin: 1.2

## 2010-10-04 LAB — BASIC METABOLIC PANEL
BUN: 8
BUN: 7
CO2: 26
Calcium: 8.7
Chloride: 102
Creatinine, Ser: 0.96
Creatinine, Ser: 0.9
GFR calc non Af Amer: >60
Glucose, Bld: 152 — ABNORMAL HIGH
Glucose, Bld: 116 — ABNORMAL HIGH

## 2010-10-04 LAB — URINALYSIS, ROUTINE W REFLEX MICROSCOPIC
Ketones, ur: 40 — AB
Nitrite: NEGATIVE
Protein, ur: 100 — AB
pH: 5.5

## 2010-10-04 LAB — LIPID PANEL
Cholesterol: 118
Total CHOL/HDL Ratio: 2.5

## 2010-10-04 LAB — DIFFERENTIAL
Basophils Absolute: 0
Basophils Absolute: 0
Basophils Relative: 0
Basophils Relative: 0
Eosinophils Absolute: 0
Eosinophils Absolute: 0
Eosinophils Absolute: 0.1
Eosinophils Relative: 0
Lymphs Abs: 0.9
Monocytes Absolute: 0.5
Monocytes Relative: 9
Neutrophils Relative %: 83 — ABNORMAL HIGH
Neutrophils Relative %: 85 — ABNORMAL HIGH

## 2010-10-04 LAB — LACTATE DEHYDROGENASE: LDH: 173

## 2010-10-04 LAB — LIPASE, BLOOD
Lipase: 386 — ABNORMAL HIGH
Lipase: >2000 — ABNORMAL HIGH

## 2010-10-04 LAB — CREATININE, SERUM
Creatinine, Ser: 0.93
GFR calc non Af Amer: >60

## 2010-10-04 LAB — SEDIMENTATION RATE: Sed Rate: 20 — ABNORMAL HIGH

## 2010-10-04 LAB — LACTIC ACID, PLASMA: Lactic Acid, Venous: 1.5

## 2010-10-09 LAB — CREATININE, SERUM: GFR calc non Af Amer: >60

## 2010-10-09 LAB — BUN: BUN: 13

## 2010-10-15 ENCOUNTER — Telehealth: Payer: Self-pay | Admitting: Pulmonary Disease

## 2010-10-22 MED ORDER — ROSUVASTATIN CALCIUM 20 MG PO TABS: 20.0000 mg | ORAL_TABLET | Freq: Every day | ORAL | Status: DC

## 2010-10-22 NOTE — Telephone Encounter (Signed)
Pt is coming in 10/25 at 3:00 for surgery clearance. Pt also stated he is out of crestor and will need an rx sent to get him through until he can see SN. I advised will send rx but needs to show up for OV for further refills. Pt verbalized understanding and had no questions

## 2010-10-22 NOTE — Telephone Encounter (Signed)
SN is booked up until 10-25 in the afternoon is his next aval. Unless someone cancels.  We will need to get appt before SN can give clearance for surgery.  Thanks.

## 2010-11-01 ENCOUNTER — Encounter: Payer: Self-pay | Admitting: Pulmonary Disease

## 2010-11-01 ENCOUNTER — Ambulatory Visit (INDEPENDENT_AMBULATORY_CARE_PROVIDER_SITE_OTHER): Payer: Medicare Other | Admitting: Pulmonary Disease

## 2010-11-01 VITALS — BP 128/84 | HR 60 | Temp 98.5°F | Ht 72.0 in | Wt 181.0 lb

## 2010-11-01 DIAGNOSIS — K449 Diaphragmatic hernia without obstruction or gangrene: Secondary | ICD-10-CM

## 2010-11-01 DIAGNOSIS — D126 Benign neoplasm of colon, unspecified: Secondary | ICD-10-CM

## 2010-11-01 DIAGNOSIS — E7800 Pure hypercholesterolemia, unspecified: Secondary | ICD-10-CM

## 2010-11-01 DIAGNOSIS — M1990 Unspecified osteoarthritis, unspecified site: Secondary | ICD-10-CM

## 2010-11-01 DIAGNOSIS — Z87898 Personal history of other specified conditions: Secondary | ICD-10-CM

## 2010-11-01 DIAGNOSIS — Z01818 Encounter for other preprocedural examination: Secondary | ICD-10-CM

## 2010-11-01 DIAGNOSIS — Z23 Encounter for immunization: Secondary | ICD-10-CM

## 2010-11-01 DIAGNOSIS — R972 Elevated prostate specific antigen [PSA]: Secondary | ICD-10-CM

## 2010-11-01 DIAGNOSIS — K5732 Diverticulitis of large intestine without perforation or abscess without bleeding: Secondary | ICD-10-CM

## 2010-11-01 MED ORDER — ROSUVASTATIN CALCIUM 20 MG PO TABS: 20.0000 mg | ORAL_TABLET | Freq: Every day | ORAL | Status: DC

## 2010-11-01 NOTE — Patient Instructions (Signed)
Today we updated your med list in our EPIC system...    Continue your current medications the same...    We refilled your meds per request...  Today we did your f/u EKG- it is WNL & you are cleared for surgery...    Good luck w/ the knee replacement...    We will send records to [facility]...  Call for any questions...  Let's plan a check up next spring w/ FASTING blood work.[name] KitchenMarland Kitchen

## 2010-11-09 ENCOUNTER — Encounter: Payer: Self-pay | Admitting: Pulmonary Disease

## 2010-11-09 NOTE — Progress Notes (Signed)
Subjective:    Patient ID: Thomas Pearson, male    DOB: Jan 03, 1941, 70 y.o.   MRN: [id number]  HPI 70 y/o WM here for a follow up visit & CPX...   ~  June 28, 2009:  I last saw [name] 9/08 for a yearly ROV w/ mult medical problems as listed below & hx HH/ GERD on Nexium, and Hypercholesterolemia on Simva80... he was hospitalized by GI 6/09 w/ idiopathic pancreatitis> Abd Sonar & CT Abd showed inflamm/ edematous changes around the duod, pancreas, & retroperitoneum (there was a duod divertic, GB was OK)... he improved w/ conservative management;  outpt EGD by Thomas Pearson 6/09 showed 2cmHH & stricture in distal esoph...  MR Abd 7/09 showed improved pancreatitis/ resolved edema, +duod divertic, norm GB & ducts...  no recurrent problem since then- he knows to avoid alcohol...  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 he uses Tylenol for pain...  he had labs at insurance co 3/11 trying to get add'l life insurance & FLP looked poor off his Simva, and PSA was elevated at 5.52> so he set up this f/u appt.  ~  November 01, 2010:  78mo ROV & he needs medical clearance for left TKR- had recent left knee injury w/ hemarthrosis, eval by Thomas Pearson & Thomas Pearson "It's bone-on-bone" he says;  He had episode abd pain & went to ER 9/12> eval revealed lower abd tenderness, norm labs, CT w/ ?poss early diverticulitis, given cipro/ flagyl/ vicodin; he had f/u appt Thomas Pearson & symptoms had resolved> Colonoscopy 09/27/10 showed divertics, & several polyps= tubular adenoma & f/u planned 65yrs.     <Hx PAF> on ASA81mg  daily; BP is normal & he denies CP, palpit, dizzy, SOB, edema, etc...    <Chol> on Cres20 & he is not fasting for FLP today- see below, & he will ret for FLP on the Crestor20...    <HH/ Esophagitis> on Prilosec 20mg /d; he denies upper abd pain, N/ V, dysphagia, etc...    <Divertics, Polyps> on stool softener  daily; had bout of diverticulitis 9/12 as above- resolved w/ cipro/ flagyl; subseq f/u Thomas Pearson w/ Colonoscopy 9/12 revealing divertics, one polyp removed= tubular adenoma & repeat planned 47yrs...    <Hx pancreatitis> no recurrence since his only bout in 2009; he knows to avoid Etoh etc...    <BPH, elev PSA> he had f/u Thomas Pearson 8/12- s/p TURP 2000 for BPH, PSA 4.34 in 2006 w/ neg Bx; PSA 8/11 was 7.03 w/ repeat Bx also neg for malig; PSA by Thomas Pearson was 5.70 (%free=24, risk of Ca in this 70y/o is 15-30%); they plan f/u in 6 months...    <DJD/ DDD> followed by Thomas Pearson; we do not have any recent notes from them; Pt states his left knee is bone-on-bone & needs TKR...          Problem List:   ALLERGIC RHINITIS (ICD-477.9) - he uses OTC antihist Prn + Nasal Saline...  ABNORMAL ELECTROCARDIOGRAM (ICD-794.31) PAROXYSMAL AFIB >> he he transient AFib reported in the ER w/ his acute pancreatitis 6/09> self limited & ret to NSR... otherw EKG's have been normal x PAC's noted intermittently & he is asymptomatic- denies CP, palpit, dizzy, etc... ~  baseline EKGs showed NSR, WNL... ~  Myoview 7/06 showed occas PAC/ PVC, hypertensive response, good exerc tolerance, diaph attenuation w/o definte scar, no ischemia... ~  EKG 6/11 showed NSR  w/ PAC, otherw WNL/ NAD... ~  EKG 10/12 showed NSR, rate 62, otherw WNL/ NAD...  HYPERCHOLESTEROLEMIA (ICD-272.0) - hx hypercholesterolemia prev on Simva80 but this was stopped 6/09 hosp for pancreatitis & pt never followed up... rec to start CRESTOR 20mg /d + diet Rx... ~  FLP 3/04 on diet alone showed TChol 304, TG 130, HDL 53, LDL 225... rec start Simva40. ~  FLP 9/04 on Simva40 showed TChol 205, Tg 103, HDL 50, LDL 135... rec incr to Simva80. ~  FLP 7/06 on diet alone showed TChol 283, TG 111, HDL 48, LDL 209... rec restart Simva80. ~  FLP 11/08 on Simva80 showed TChol 151, TG 68, HDL 50, LDL 87 ~  FLP 3/11 by insurance co on diet alone showed TChol 263, TG  118, HDL 61, LDL 178 ~  6/11:  rec to start CRESTOR 20mg /d + low chol/ low fat diet... ~  Pt never ret for follow up FLP & is asked to ret FASTING for blood work...  HIATAL HERNIA (ICD-553.3) & ESOPHAGITIS, REFLUX (ICD-530.11) - on OMEPRAZOLE 20mg /d regularly...  ~  EGD 5/05 by [facility] showed HH, gastritis, & treated w/ Nexium 40mg /d... ~  EGD 6/09 by Thomas Pearson showed 2cmHH, & esoph stricture...  DIVERTICULOSIS, COLON (ICD-562.10), & COLONIC POLYPS (ICD-211.3) ~  colonoscopy 5/05 by [facility] showed divertics, diminutive colon polyps, hems... ~  9/12: ER visit w/ abd pain- CTAbd w/ ?poss mild diverticulitis, treated w/ Cipro/ Flagyl & improved... ~  Subseq f/u eval by Thomas Pearson w/ colonoscopy 9/12 revealing divertics & one polyp= tubular adenoma & repeat request in 14yrs.  Hx of ACUTE PANCREATITIS (ICD-577.0) - see 6/09 hospitalization and f/u by Thomas Pearson... ~  No known recurrence of the pancreatitis, GB work up was neg,?of relation to lipids, he knows to avoid Etoh...  BENIGN PROSTATIC HYPERTROPHY, HX OF (ICD-V13.8)   ELEVATED PROSTATE SPECIFIC ANTIGEN (ICD-790.93) - long hx elevated PSA's w/ TURP 9/00 by Thomas Pearson- benign tissue... follow up yearly PSA's in the 4-5 range and prostate bx 9/06 was also benign...  ~  labs 3/11 by insurance co showed PSA= 5.52 ~  labs 6/11 here showed PSA= 6.30 & [facility]... ~  Eval by Thomas Pearson w/ repeat Bx 8/11- all benign, & they continue to follow his PSAs every 48mo...  DEGENERATIVE JOINT DISEASE (ICD-715.90)  DEGENERATIVE DISC DISEASE, CERVICAL SPINE (ICD-722.4) - followed by Thomas Pearson for Rheum w/ signif DJD knees- s/p Synvisc shots & he's been told he'll need TKR's...  also hx chronic neck pain & DDD> he uses Tylenol, heat, etc...   No past surgical history on file.   Outpatient Encounter Prescriptions as of 11/01/2010  Medication Sig Dispense Refill  . aspirin 81 MG tablet Take 81 mg by mouth daily.        [name] Kitchen  HYDROcodone-acetaminophen (NORCO) 5-325 MG per tablet Take 1 tablet by mouth every 8 (eight) hours as needed.       [name] Kitchen omeprazole (PRILOSEC) 20 MG capsule Take 20 mg by mouth daily.       . rosuvastatin (CRESTOR) 20 MG tablet Take 1 tablet (20 mg total) by mouth at bedtime. Appointment needed for further refills.  30 tablet  11  . DISCONTD: ciprofloxacin (CIPRO) 500 MG tablet Take 500 mg by mouth 2 (two) times daily.        [name] Kitchen DISCONTD: metroNIDAZOLE (FLAGYL) 500 MG tablet Take 500 mg by mouth 2 (two) times daily.        [name] Kitchen DISCONTD:  ondansetron (ZOFRAN-ODT) 8 MG disintegrating tablet         Allergies  Allergen Reactions  . Oxycodone-Acetaminophen     REACTION: hives    Current Medications, Allergies, Past Medical History, Past Surgical History, Family History, and Social History were reviewed in [employer] record.    Review of Systems       The patient complains of joint pain, stiffness, and arthritis.  The patient denies fever, chills, sweats, anorexia, fatigue, weakness, malaise, weight loss, sleep disorder, blurring, diplopia, eye irritation, eye discharge, vision loss, eye pain, photophobia, earache, ear discharge, tinnitus, decreased hearing, nasal congestion, nosebleeds, sore throat, hoarseness, chest pain, palpitations, syncope, dyspnea on exertion, orthopnea, PND, peripheral edema, cough, dyspnea at rest, excessive sputum, hemoptysis, wheezing, pleurisy, nausea, vomiting, diarrhea, constipation, change in bowel habits, abdominal pain, melena, hematochezia, jaundice, gas/bloating, indigestion/heartburn, dysphagia, odynophagia, dysuria, hematuria, urinary frequency, urinary hesitancy, nocturia, incontinence, back pain, joint swelling, muscle cramps, muscle weakness, sciatica, restless legs, leg pain at night, leg pain with exertion, rash, itching, dryness, suspicious lesions, paralysis, paresthesias, seizures, tremors, vertigo, transient blindness, frequent falls,  frequent headaches, difficulty walking, depression, anxiety, memory loss, confusion, cold intolerance, heat intolerance, polydipsia, polyphagia, polyuria, unusual weight change, abnormal bruising, bleeding, enlarged lymph nodes, urticaria, allergic rash, hay fever, and recurrent infections.    Objective:   Physical Exam     WD, WN, 70 y/o WM in NAD... GENERAL:  Alert & oriented; pleasant & cooperative. HEENT:  [place]/AT, EOM-wnl, PERRLA, Fundi-benign, EACs-clear, TMs-wnl, NOSE-clear, THROAT-clear & wnl. NECK:  Supple w/ fairROM; no JVD; normal carotid impulses w/o bruits; no thyromegaly or nodules palpated; no lymphadenopathy. CHEST:  Clear to P & A; without wheezes/ rales/ or rhonchi. HEART:  Regular Rhythm; without murmurs/ rubs/ or gallops detected... ABDOMEN:  Soft & nontender; normal bowel sounds; no organomegaly or masses palpated... EXT: without deformities, +arthritic changes; no varicose veins/ venous insuffic/ or edema. NEURO:  CN's intact; motor testing normal; sensory testing normal; gait normal & balance OK. DERM:  No lesions noted; no rash etc...  RADIOLOGY DATA:  Reviewed in the EPIC EMR & discussed w/ the patient...  LABORATORY DATA:  Reviewed in the EPIC EMR & discussed w/ the patient...   Assessment & Plan:   <Hx PAF> on ASA81mg  daily; BP is normal & he remains asymptomatic; EKG shows NSR, WNL.[name] Kitchen.     <Chol> on Cres20 & he is not fasting for FLP today- see above, & he will ret for FLP on the Crestor20...     <HH/ Esophagitis> on Prilosec 20mg /d; he remains asymptomatic...     <Divertics, Polyps> on stool softener daily; had bout of diverticulitis 9/12 as above- resolved w/ cipro/ flagyl; subseq f/u Thomas Pearson w/ Colonoscopy 9/12 revealing divertics, one polyp removed= tubular adenoma & repeat planned 26yrs...     <Hx pancreatitis> no recurrence since his only bout in 2009; he knows to avoid Etoh etc...     <BPH, elev PSA> he had f/u Thomas Pearson 8/12- s/p TURP 2000 for BPH,  elev PSAs w/ Bx's on 2 sep occas & both benign; he continues Q57mo follow up w/ Urology.     <DJD/ DDD> followed by Thomas Pearson; we do not have any recent notes from them; Pt states his left knee is bone-on-bone & needs TKR.[name] KitchenMarland Kitchen

## 2010-12-11 ENCOUNTER — Encounter (HOSPITAL_COMMUNITY): Payer: Self-pay | Admitting: Pharmacy Technician

## 2010-12-17 ENCOUNTER — Encounter (HOSPITAL_COMMUNITY): Payer: Self-pay

## 2010-12-17 ENCOUNTER — Encounter (HOSPITAL_COMMUNITY)
Admission: RE | Admit: 2010-12-17 | Discharge: 2010-12-17 | Disposition: A | Discharge status: Home / Self Care | Payer: Medicare Other | Source: Ambulatory Visit | Attending: Orthopedic Surgery | Admitting: Orthopedic Surgery

## 2010-12-17 HISTORY — DX: Unspecified osteoarthritis, unspecified site: M19.90

## 2010-12-17 HISTORY — DX: Diaphragmatic hernia without obstruction or gangrene: K44.9

## 2010-12-17 LAB — CBC
HCT: 44 % (ref 39.0–52.0)
Hemoglobin: 14.8 g/dL (ref 13.0–17.0)
MCH: 29.4 pg (ref 26.0–34.0)
MCHC: 33.6 g/dL (ref 30.0–36.0)
RDW: 12.5 % (ref 11.5–15.5)

## 2010-12-17 LAB — SURGICAL PCR SCREEN: Staphylococcus aureus: NEGATIVE

## 2010-12-17 LAB — ABO/RH: ABO/RH(D): O POS

## 2010-12-17 NOTE — Pre-Procedure Instructions (Signed)
20 Thomas Pearson  12/17/2010   Your procedure is scheduled on:  12-28-10  Report to [facility] at 0530 AM.  Call this number if you have problems the morning of surgery: [phone]   Remember:   Do not eat food:After Midnight.  May have clear liquids: up to 4 Hours before arrival.  Clear liquids include soda, tea, black coffee, apple or grape juice, broth.  Take these medicines the morning of surgery with A SIP OF WATER: PRILOSEC   Do not wear jewelry, make-up or nail polish.  Do not wear lotions, powders, or perfumes. You may wear deodorant.  Do not shave 48 hours prior to surgery.  Do not bring valuables to the hospital.  Contacts, dentures or bridgework may not be worn into surgery.  Leave suitcase in the car. After surgery it may be brought to your room.  For patients admitted to the hospital, checkout time is 11:00 AM the day of discharge.   Patients discharged the day of surgery will not be allowed to drive home.  Name and phone number of your driver: ANTHONY-SON [phone]  Special Instructions: CHG Shower Use Special Wash: 1/2 bottle night before surgery and 1/2 bottle morning of surgery.   Please read over the following fact sheets that you were given: Pain Booklet, Coughing and Deep Breathing, Blood Transfusion Information, Total Joint Packet, MRSA Information and Surgical Site Infection Prevention

## 2010-12-19 ENCOUNTER — Other Ambulatory Visit: Payer: Self-pay | Admitting: Physician Assistant

## 2010-12-24 NOTE — H&P (Signed)
 NAME: Thomas Pearson  MRN: #[id number] DATE: December 13, 2010 DOB: 12/06/1940  CHIEF COMPLAINT:  Followup osteoarthritis left knee.   HISTORY OF PRESENT ILLNESS:  Thomas Pearson is a 70-year-old male with a history of left knee osteoarthritis end-stage.  He also has GERD, hypercholesterolemia and history of idiopathic pancreatitis. He has failed conservative treatment with oral anti-inflammatories, viscosupplementation and corticosteroid injections.  He has previous x-rays documenting bone-on-bone arthritis of left knee.  It is significantly affecting his quality of life and ADLs.  He has the most pain when weightbearing and it occasionally awakens him from sleep at night.  Risks and benefits of total knee arthroplasty were discussed at previous visits and he is here today for consideration of this.  He has obtained medical clearance from his primary care physician, Dr. Scott Nadel.     REVIEW OF SYSTEMS: Positive for glasses, contacts, ringing in the ears, dentures, diarrhea, constipation, hemorrhoids and nosebleeds.   PAST MEDICAL HISTORY:  GERD, hypercholesterolemia and pancreatitis 2009.   PAST SURGICAL HISTORY:  Circumcision 1954, history of prostate procedure, and TURP 2001.   CURRENT MEDICATIONS:  Omeprazole 20 mg daily, Crestor 20 mg one at bedtime, 81 mg aspirin daily, stool softener 100 mg morning and night.  ALLERGIES:  He states he has a drug allergy to Percocet which caused hives in the past.  He has tolerated hydrocodone.   FAMILY HISTORY: Positive for heart attack in his father, diabetes in a sister, cancer in his brother and high blood pressure in his child.   SOCIAL HISTORY:  The patient is a past smoker; pack per day for 10 years; stopped in 1967.  He does not drink alcohol.  He is widowed and retired from [employer].  He has one living child that he lives with and lives in a two-story residence.    EXAMINATION:  The patient is seated in the exam room in no  acute distress. He is alert and oriented and appears appropriate age.  Height 6 foot and weight 175 pounds; BMI calculated at 23.7.  Vital signs:  Temperature 97.3; pulse 61; respiration 18; blood pressure 151/87. HEENT:  Pupils are equal and reactive to light.  He has full upper and lower dentures. Neck is supple with good range of motion.   Chest and lungs are clear to auscultation bilaterally.  Cardiac exam is regular rate and rhythm; no obvious murmur. Abdomen is soft and nontender; active bowel sounds in all 4 quadrants. Cranial nerves are grossly intact. The skin has no rashes or lesions. Rectal exam not indicated for surgery. Examination of the left lower extremity shows he is neurovascularly intact. He has tenderness to palpation along the medial joint line and positive patellofemoral crepitus. He has painful range of motion and ambulates with an antalgic gait favoring the left leg.   IMAGING:  No images were obtained today.  X-rays of left knee taken 10/15/2010 show a varus knee with severe medial compartment narrowing and patellofemoral spurring; bone-on-bone arthritis.   ASSESSMENT:   1. End-stage osteoarthritis left knee. 2. Hypercholesterolemia. 3. GERD.   PLAN:  Risks and benefits of total knee replacement were discussed with the patient and he wishes to proceed.  He has been scheduled for surgery on 12/28/2010 and scheduled for preadmission testing on 12/17/2010.  He has obtained his medical clearance.  The patient was given preoperative instructions and will be placed on Lovenox postoperatively for DVT prophylaxis.  He will be given perioperative antibiotics, Ancef.  He   is planning to go home following the surgery pending clearance from inpatient therapy.  Home PT has been arranged with [facility].    Josh Jaquilla Woodroof, P.A.-C/[id number]  Auto-Authenticated by Josh Anetta Olvera, P.A.-C 

## 2010-12-26 ENCOUNTER — Encounter (HOSPITAL_COMMUNITY)
Admission: RE | Admit: 2010-12-26 | Discharge: 2010-12-26 | Disposition: A | Discharge status: Home / Self Care | Payer: Medicare Other | Source: Ambulatory Visit | Attending: Orthopedic Surgery | Admitting: Orthopedic Surgery

## 2010-12-26 LAB — PROTIME-INR
INR: 0.98 (ref 0.00–1.49)
Prothrombin Time: 13.2 seconds (ref 11.6–15.2)

## 2010-12-26 LAB — URINALYSIS, ROUTINE W REFLEX MICROSCOPIC
Bilirubin Urine: NEGATIVE
Nitrite: NEGATIVE
Specific Gravity, Urine: 1.021 (ref 1.005–1.030)
Urobilinogen, UA: 1 mg/dL (ref 0.0–1.0)

## 2010-12-26 LAB — DIFFERENTIAL
Lymphocytes Relative: 33 % (ref 12–46)
Lymphs Abs: 2.3 10*3/uL (ref 0.7–4.0)
Neutro Abs: 3.9 10*3/uL (ref 1.7–7.7)
Neutrophils Relative %: 56 % (ref 43–77)

## 2010-12-26 LAB — CBC
HCT: 43.7 % (ref 39.0–52.0)
Hemoglobin: 14.8 g/dL (ref 13.0–17.0)
MCH: 29.8 pg (ref 26.0–34.0)
MCHC: 33.9 g/dL (ref 30.0–36.0)

## 2010-12-26 LAB — COMPREHENSIVE METABOLIC PANEL
BUN: 20 mg/dL (ref 6–23)
Calcium: 9.5 mg/dL (ref 8.4–10.5)
GFR calc Af Amer: 83 mL/min — ABNORMAL LOW (ref >90)
Glucose, Bld: 114 mg/dL — ABNORMAL HIGH (ref 70–99)
Total Protein: 7 g/dL (ref 6.0–8.3)

## 2010-12-26 LAB — URINE MICROSCOPIC-ADD ON

## 2010-12-27 MED ORDER — CHLORHEXIDINE GLUCONATE 4 % EX LIQD: 60.0000 mL | Freq: Once | CUTANEOUS | Status: DC

## 2010-12-27 MED ORDER — CEFAZOLIN SODIUM-DEXTROSE 2-3 GM-% IV SOLR
2.0000 g | INTRAVENOUS | Status: AC
  Administered 2010-12-28: 2 g via INTRAVENOUS
  Filled 2010-12-27: qty 50

## 2010-12-27 MED ORDER — ACETAMINOPHEN 10 MG/ML IV SOLN
1000.0000 mg | Freq: Four times a day (QID) | INTRAVENOUS | Status: DC
  Administered 2010-12-28: 1000 mg via INTRAVENOUS
  Filled 2010-12-27 (×3): qty 100

## 2010-12-27 MED ORDER — SODIUM CHLORIDE 0.9 % IV SOLN: INTRAVENOUS | Status: DC

## 2010-12-28 ENCOUNTER — Encounter (HOSPITAL_COMMUNITY): Payer: Self-pay | Admitting: Anesthesiology

## 2010-12-28 ENCOUNTER — Inpatient Hospital Stay (HOSPITAL_COMMUNITY): Payer: Medicare Other

## 2010-12-28 ENCOUNTER — Encounter (HOSPITAL_COMMUNITY)
Admission: RE | Disposition: A | Discharge status: Home Health | Payer: Self-pay | Source: Ambulatory Visit | Attending: Orthopedic Surgery

## 2010-12-28 ENCOUNTER — Inpatient Hospital Stay (HOSPITAL_COMMUNITY)
Admission: RE | Admit: 2010-12-28 | Discharge: 2010-12-30 | DRG: 470 | Disposition: A | Discharge status: Home Health | Payer: Medicare Other | Source: Ambulatory Visit | Attending: Orthopedic Surgery | Admitting: Orthopedic Surgery

## 2010-12-28 ENCOUNTER — Inpatient Hospital Stay (HOSPITAL_COMMUNITY): Payer: Medicare Other | Admitting: Anesthesiology

## 2010-12-28 DIAGNOSIS — Z01812 Encounter for preprocedural laboratory examination: Secondary | ICD-10-CM

## 2010-12-28 DIAGNOSIS — Z87891 Personal history of nicotine dependence: Secondary | ICD-10-CM

## 2010-12-28 DIAGNOSIS — K449 Diaphragmatic hernia without obstruction or gangrene: Secondary | ICD-10-CM | POA: Diagnosis present

## 2010-12-28 DIAGNOSIS — E785 Hyperlipidemia, unspecified: Secondary | ICD-10-CM | POA: Diagnosis present

## 2010-12-28 DIAGNOSIS — K219 Gastro-esophageal reflux disease without esophagitis: Secondary | ICD-10-CM | POA: Diagnosis present

## 2010-12-28 DIAGNOSIS — M1990 Unspecified osteoarthritis, unspecified site: Secondary | ICD-10-CM | POA: Insufficient documentation

## 2010-12-28 DIAGNOSIS — M1710 Unilateral primary osteoarthritis, unspecified knee: Principal | ICD-10-CM | POA: Diagnosis present

## 2010-12-28 HISTORY — PX: TOTAL KNEE ARTHROPLASTY: SHX125

## 2010-12-28 SURGERY — ARTHROPLASTY, KNEE, TOTAL
Anesthesia: Regional | Site: Knee | Laterality: Left

## 2010-12-28 MED ORDER — DIPHENHYDRAMINE HCL 12.5 MG/5ML PO ELIX
12.5000 mg | ORAL_SOLUTION | ORAL | Status: DC | PRN
  Filled 2010-12-28: qty 10

## 2010-12-28 MED ORDER — SODIUM CHLORIDE 0.9 % IV SOLN: INTRAVENOUS | Status: DC

## 2010-12-28 MED ORDER — MIDAZOLAM HCL 2 MG/2ML IJ SOLN: 1.0000 mg | INTRAMUSCULAR | Status: DC | PRN

## 2010-12-28 MED ORDER — METOCLOPRAMIDE HCL 5 MG PO TABS
5.0000 mg | ORAL_TABLET | Freq: Three times a day (TID) | ORAL | Status: DC | PRN
Start: 2010-12-28 — End: 2010-12-30
  Filled 2010-12-28: qty 2

## 2010-12-28 MED ORDER — ONDANSETRON HCL 4 MG/2ML IJ SOLN: 4.0000 mg | Freq: Four times a day (QID) | INTRAMUSCULAR | Status: DC | PRN

## 2010-12-28 MED ORDER — ONDANSETRON HCL 4 MG PO TABS: 4.0000 mg | ORAL_TABLET | Freq: Four times a day (QID) | ORAL | Status: DC | PRN

## 2010-12-28 MED ORDER — ONDANSETRON HCL 4 MG/2ML IJ SOLN
INTRAMUSCULAR | Status: DC | PRN
  Administered 2010-12-28 (×2): 2 mg via INTRAVENOUS

## 2010-12-28 MED ORDER — MIDAZOLAM HCL 5 MG/5ML IJ SOLN
INTRAMUSCULAR | Status: DC | PRN
  Administered 2010-12-28 (×2): 1 mg via INTRAVENOUS

## 2010-12-28 MED ORDER — METOCLOPRAMIDE HCL 5 MG/ML IJ SOLN
5.0000 mg | Freq: Three times a day (TID) | INTRAMUSCULAR | Status: DC | PRN
  Filled 2010-12-28: qty 2

## 2010-12-28 MED ORDER — ROCURONIUM BROMIDE 100 MG/10ML IV SOLN
INTRAVENOUS | Status: DC | PRN
  Administered 2010-12-28: 50 mg via INTRAVENOUS

## 2010-12-28 MED ORDER — SODIUM CHLORIDE 0.9 % IJ SOLN: 9.0000 mL | INTRAMUSCULAR | Status: DC | PRN

## 2010-12-28 MED ORDER — WARFARIN VIDEO
Freq: Once | Status: AC
  Administered 2010-12-29: 18:00:00

## 2010-12-28 MED ORDER — BISACODYL 10 MG RE SUPP: 10.0000 mg | Freq: Every day | RECTAL | Status: DC | PRN

## 2010-12-28 MED ORDER — ENOXAPARIN SODIUM 30 MG/0.3ML ~~LOC~~ SOLN
30.0000 mg | Freq: Two times a day (BID) | SUBCUTANEOUS | Status: DC
  Administered 2010-12-29 – 2010-12-30 (×3): 30 mg via SUBCUTANEOUS
  Filled 2010-12-28 (×4): qty 0.3

## 2010-12-28 MED ORDER — HYDROMORPHONE HCL PF 1 MG/ML IJ SOLN
0.2500 mg | INTRAMUSCULAR | Status: DC | PRN
  Administered 2010-12-28 (×2): 0.25 mg via INTRAVENOUS
  Filled 2010-12-28: qty 0.5

## 2010-12-28 MED ORDER — MENTHOL 3 MG MT LOZG: 1.0000 | LOZENGE | OROMUCOSAL | Status: DC | PRN

## 2010-12-28 MED ORDER — PHENOL 1.4 % MT LIQD
1.0000 | OROMUCOSAL | Status: DC | PRN
  Filled 2010-12-28: qty 177

## 2010-12-28 MED ORDER — GLYCOPYRROLATE 0.2 MG/ML IJ SOLN
INTRAMUSCULAR | Status: DC | PRN
  Administered 2010-12-28: .4 mg via INTRAVENOUS

## 2010-12-28 MED ORDER — ACETAMINOPHEN 10 MG/ML IV SOLN
INTRAVENOUS | Status: AC
  Filled 2010-12-28: qty 100

## 2010-12-28 MED ORDER — BUPIVACAINE-EPINEPHRINE PF 0.5-1:200000 % IJ SOLN
INTRAMUSCULAR | Status: DC | PRN
  Administered 2010-12-28: 30 mL

## 2010-12-28 MED ORDER — DIPHENHYDRAMINE HCL 12.5 MG/5ML PO ELIX
12.5000 mg | ORAL_SOLUTION | Freq: Four times a day (QID) | ORAL | Status: DC | PRN
  Filled 2010-12-28: qty 5

## 2010-12-28 MED ORDER — ALUM & MAG HYDROXIDE-SIMETH 200-200-20 MG/5ML PO SUSP: 30.0000 mL | ORAL | Status: DC | PRN

## 2010-12-28 MED ORDER — SODIUM CHLORIDE 0.9 % IR SOLN
Status: DC | PRN
  Administered 2010-12-28: 3000 mL

## 2010-12-28 MED ORDER — PROPOFOL 10 MG/ML IV EMUL
INTRAVENOUS | Status: DC | PRN
  Administered 2010-12-28: 70 mg via INTRAVENOUS
  Administered 2010-12-28: 180 mg via INTRAVENOUS

## 2010-12-28 MED ORDER — EPHEDRINE SULFATE 50 MG/ML IJ SOLN
INTRAMUSCULAR | Status: DC | PRN
  Administered 2010-12-28 (×3): 10 mg via INTRAVENOUS

## 2010-12-28 MED ORDER — CEFAZOLIN SODIUM 1-5 GM-% IV SOLN
1.0000 g | Freq: Four times a day (QID) | INTRAVENOUS | Status: AC
  Administered 2010-12-28 – 2010-12-29 (×3): 1 g via INTRAVENOUS
  Filled 2010-12-28 (×3): qty 50

## 2010-12-28 MED ORDER — METHOCARBAMOL 500 MG PO TABS
500.0000 mg | ORAL_TABLET | Freq: Four times a day (QID) | ORAL | Status: DC | PRN
  Administered 2010-12-28 – 2010-12-29 (×2): 500 mg via ORAL
  Filled 2010-12-28 (×2): qty 1

## 2010-12-28 MED ORDER — WARFARIN SODIUM 7.5 MG PO TABS
7.5000 mg | ORAL_TABLET | Freq: Once | ORAL | Status: AC
  Administered 2010-12-28: 7.5 mg via ORAL
  Filled 2010-12-28: qty 1

## 2010-12-28 MED ORDER — LACTATED RINGERS IV SOLN
INTRAVENOUS | Status: DC | PRN
  Administered 2010-12-28 (×2): via INTRAVENOUS

## 2010-12-28 MED ORDER — ONDANSETRON HCL 4 MG/2ML IJ SOLN
4.0000 mg | Freq: Four times a day (QID) | INTRAMUSCULAR | Status: DC | PRN
  Administered 2010-12-28: 4 mg via INTRAVENOUS
  Filled 2010-12-28: qty 2

## 2010-12-28 MED ORDER — ZOLPIDEM TARTRATE 5 MG PO TABS: 5.0000 mg | ORAL_TABLET | Freq: Every evening | ORAL | Status: DC | PRN

## 2010-12-28 MED ORDER — NEOSTIGMINE METHYLSULFATE 1 MG/ML IJ SOLN
INTRAMUSCULAR | Status: DC | PRN
  Administered 2010-12-28: 2 mg via INTRAVENOUS

## 2010-12-28 MED ORDER — COUMADIN BOOK
Freq: Once | Status: DC
  Filled 2010-12-28: qty 1

## 2010-12-28 MED ORDER — HYDROMORPHONE 0.3 MG/ML IV SOLN
INTRAVENOUS | Status: DC
  Administered 2010-12-28: 0.2 mg via INTRAVENOUS
  Administered 2010-12-28: 0.4 mg via INTRAVENOUS
  Administered 2010-12-28: 0.2 mg via INTRAVENOUS
  Administered 2010-12-29: 0.4 mg via INTRAVENOUS
  Administered 2010-12-29: 0.5 mg via INTRAVENOUS
  Administered 2010-12-29: 0.8 mg via INTRAVENOUS
  Administered 2010-12-29: 0.599 mg via INTRAVENOUS

## 2010-12-28 MED ORDER — NALOXONE HCL 0.4 MG/ML IJ SOLN
0.4000 mg | INTRAMUSCULAR | Status: DC | PRN
  Filled 2010-12-28: qty 1

## 2010-12-28 MED ORDER — FLEET ENEMA 7-19 GM/118ML RE ENEM: 1.0000 | ENEMA | Freq: Once | RECTAL | Status: AC | PRN

## 2010-12-28 MED ORDER — FENTANYL CITRATE 0.05 MG/ML IJ SOLN
INTRAMUSCULAR | Status: DC | PRN
  Administered 2010-12-28 (×4): 50 ug via INTRAVENOUS
  Administered 2010-12-28: 100 ug via INTRAVENOUS
  Administered 2010-12-28: 50 ug via INTRAVENOUS

## 2010-12-28 MED ORDER — DOCUSATE SODIUM 100 MG PO CAPS
100.0000 mg | ORAL_CAPSULE | Freq: Two times a day (BID) | ORAL | Status: DC
  Administered 2010-12-29 – 2010-12-30 (×3): 100 mg via ORAL
  Filled 2010-12-28 (×4): qty 1

## 2010-12-28 MED ORDER — SENNOSIDES-DOCUSATE SODIUM 8.6-50 MG PO TABS: 1.0000 | ORAL_TABLET | Freq: Every evening | ORAL | Status: DC | PRN

## 2010-12-28 MED ORDER — FENTANYL CITRATE 0.05 MG/ML IJ SOLN: 50.0000 ug | INTRAMUSCULAR | Status: DC | PRN

## 2010-12-28 MED ORDER — METHOCARBAMOL 100 MG/ML IJ SOLN
500.0000 mg | Freq: Four times a day (QID) | INTRAVENOUS | Status: DC | PRN
  Filled 2010-12-28: qty 5

## 2010-12-28 MED ORDER — ONDANSETRON HCL 4 MG/2ML IJ SOLN
4.0000 mg | Freq: Four times a day (QID) | INTRAMUSCULAR | Status: DC | PRN
  Filled 2010-12-28: qty 2

## 2010-12-28 MED ORDER — DIPHENHYDRAMINE HCL 50 MG/ML IJ SOLN
12.5000 mg | Freq: Four times a day (QID) | INTRAMUSCULAR | Status: DC | PRN
  Filled 2010-12-28: qty 0.25

## 2010-12-28 SURGICAL SUPPLY — 60 items
BANDAGE ACE 4 STERILE (GAUZE/BANDAGES/DRESSINGS) ×1 IMPLANT
BANDAGE ELASTIC 6 VELCRO ST LF (GAUZE/BANDAGES/DRESSINGS) ×1 IMPLANT
BANDAGE ESMARK 6X9 LF (GAUZE/BANDAGES/DRESSINGS) ×1 IMPLANT
BLADE SAGITTAL 25.0X1.19X90 (BLADE) ×2 IMPLANT
BLADE SAW SAG 90X13X1.27 (BLADE) ×2 IMPLANT
BNDG CMPR 9X6 STRL LF SNTH (GAUZE/BANDAGES/DRESSINGS) ×1
BNDG ESMARK 6X9 LF (GAUZE/BANDAGES/DRESSINGS) ×2
BOWL SMART MIX CTS (DISPOSABLE) ×2 IMPLANT
CEMENT HV SMART SET (Cement) ×4 IMPLANT
CLOTH BEACON ORANGE TIMEOUT ST (SAFETY) ×2 IMPLANT
COVER BACK TABLE 24X17X13 BIG (DRAPES) IMPLANT
COVER SURGICAL LIGHT HANDLE (MISCELLANEOUS) ×2 IMPLANT
CUFF TOURNIQUET SINGLE 34IN LL (TOURNIQUET CUFF) ×2 IMPLANT
CUFF TOURNIQUET SINGLE 44IN (TOURNIQUET CUFF) IMPLANT
DRAPE INCISE IOBAN 66X45 STRL (DRAPES) IMPLANT
DRAPE ORTHO SPLIT 77X108 STRL (DRAPES) ×4
DRAPE SURG ORHT 6 SPLT 77X108 (DRAPES) ×2 IMPLANT
DRAPE U-SHAPE 47X51 STRL (DRAPES) ×2 IMPLANT
DRSG ADAPTIC 3X8 NADH LF (GAUZE/BANDAGES/DRESSINGS) ×2 IMPLANT
DRSG PAD ABDOMINAL 8X10 ST (GAUZE/BANDAGES/DRESSINGS) ×2 IMPLANT
DURAPREP 26ML APPLICATOR (WOUND CARE) ×2 IMPLANT
ELECT REM PT RETURN 9FT ADLT (ELECTROSURGICAL) ×2
ELECTRODE REM PT RTRN 9FT ADLT (ELECTROSURGICAL) ×1 IMPLANT
EVACUATOR 1/8 PVC DRAIN (DRAIN) ×2 IMPLANT
FACESHIELD LNG OPTICON STERILE (SAFETY) ×4 IMPLANT
FLOSEAL 10ML (HEMOSTASIS) IMPLANT
GAUZE XEROFORM 1X8 LF (GAUZE/BANDAGES/DRESSINGS) ×1 IMPLANT
GLOVE BIOGEL PI IND STRL 8 (GLOVE) ×2 IMPLANT
GLOVE BIOGEL PI INDICATOR 8 (GLOVE) ×2
GLOVE ORTHO TXT STRL SZ7.5 (GLOVE) ×4 IMPLANT
GLOVE SURG ORTHO 8.0 STRL STRW (GLOVE) ×6 IMPLANT
GOWN PREVENTION PLUS XLARGE (GOWN DISPOSABLE) ×2 IMPLANT
GOWN STRL NON-REIN LRG LVL3 (GOWN DISPOSABLE) ×4 IMPLANT
HANDPIECE INTERPULSE COAX TIP (DISPOSABLE) ×2
IMMOBILIZER KNEE 22 UNIV (SOFTGOODS) IMPLANT
KIT BASIN OR (CUSTOM PROCEDURE TRAY) ×2 IMPLANT
KIT ROOM TURNOVER OR (KITS) ×2 IMPLANT
MANIFOLD NEPTUNE II (INSTRUMENTS) ×2 IMPLANT
NEEDLE 22X1 1/2 (OR ONLY) (NEEDLE) IMPLANT
NS IRRIG 1000ML POUR BTL (IV SOLUTION) ×2 IMPLANT
PACK TOTAL JOINT (CUSTOM PROCEDURE TRAY) ×2 IMPLANT
PAD ARMBOARD 7.5X6 YLW CONV (MISCELLANEOUS) ×4 IMPLANT
PAD CAST 4YDX4 CTTN HI CHSV (CAST SUPPLIES) ×1 IMPLANT
PADDING CAST COTTON 4X4 STRL (CAST SUPPLIES) ×2
PADDING CAST COTTON 6X4 STRL (CAST SUPPLIES) ×2 IMPLANT
PADDING WEBRIL 4 STERILE (GAUZE/BANDAGES/DRESSINGS) ×1 IMPLANT
SET HNDPC FAN SPRY TIP SCT (DISPOSABLE) ×1 IMPLANT
SPONGE GAUZE 4X4 12PLY (GAUZE/BANDAGES/DRESSINGS) ×2 IMPLANT
STAPLER VISISTAT 35W (STAPLE) ×2 IMPLANT
SUCTION FRAZIER TIP 10 FR DISP (SUCTIONS) ×2 IMPLANT
SUT ETHIBOND NAB CT1 #1 30IN (SUTURE) ×4 IMPLANT
SUT VIC AB 0 CT1 27 (SUTURE) ×4
SUT VIC AB 0 CT1 27XBRD ANBCTR (SUTURE) ×2 IMPLANT
SUT VIC AB 2-0 CT1 27 (SUTURE) ×4
SUT VIC AB 2-0 CT1 TAPERPNT 27 (SUTURE) ×2 IMPLANT
SYR CONTROL 10ML LL (SYRINGE) IMPLANT
TOWEL OR 17X24 6PK STRL BLUE (TOWEL DISPOSABLE) ×2 IMPLANT
TOWEL OR 17X26 10 PK STRL BLUE (TOWEL DISPOSABLE) ×2 IMPLANT
TRAY FOLEY CATH 14FR (SET/KITS/TRAYS/PACK) ×2 IMPLANT
WATER STERILE IRR 1000ML POUR (IV SOLUTION) ×6 IMPLANT

## 2010-12-28 NOTE — Interval H&P Note (Signed)
History and Physical Interval Note:  12/28/2010 7:30 AM  Thomas Pearson  has presented today for surgery, with the diagnosis of osteoarthritis left knee  The various methods of treatment have been discussed with the patient and family. After consideration of risks, benefits and other options for treatment, the patient has consented to  Procedure(s): TOTAL KNEE ARTHROPLASTY as a surgical intervention .  The patients' history has been reviewed, patient examined, no change in status, stable for surgery.  I have reviewed the patients' chart and labs.  Questions were answered to the patient's satisfaction.     Shonica Weier JR,W D

## 2010-12-28 NOTE — Anesthesia Preprocedure Evaluation (Addendum)
Anesthesia Evaluation  Patient identified by MRN, date of birth, ID band Patient awake    Reviewed: Allergy & Precautions, H&P , NPO status , Patient's Chart, lab work & pertinent test results  Airway Mallampati: II TM Distance: >3 FB Neck ROM: full    Dental  (+) Edentulous Upper, Edentulous Lower and Dental Advisory Given   Pulmonary neg pulmonary ROS,  clear to auscultation        Cardiovascular Exercise Tolerance: Good Regular Normal hyperlipidemia   Neuro/Psych  Neuromuscular disease Negative Psych ROS   GI/Hepatic hiatal hernia, GERD-  Medicated and Controlled,H/o acute pancreatitis 2009   Endo/Other  Negative Endocrine ROS  Renal/GU negative Renal ROS   BPH    Musculoskeletal  (+) Arthritis -, Osteoarthritis,    Abdominal   Peds  Hematology negative hematology ROS (+)   Anesthesia Other Findings   Reproductive/Obstetrics                          Anesthesia Physical Anesthesia Plan  ASA: II  Anesthesia Plan: General and Regional   Post-op Pain Management: MAC Combined w/ Regional for Post-op pain   Induction: Intravenous  Airway Management Planned: LMA  Additional Equipment:   Intra-op Plan:   Post-operative Plan:   Informed Consent: I have reviewed the patients History and Physical, chart, labs and discussed the procedure including the risks, benefits and alternatives for the proposed anesthesia with the patient or authorized representative who has indicated his/her understanding and acceptance.     Plan Discussed with: CRNA and Surgeon  Anesthesia Plan Comments:         Anesthesia Quick Evaluation

## 2010-12-28 NOTE — Anesthesia Procedure Notes (Addendum)
Anesthesia Regional Block:  Femoral nerve block  Pre-Anesthetic Checklist: ,, timeout performed, Correct Patient, Correct Site, Correct Laterality, Correct Procedure,, site marked, risks and benefits discussed, Surgical consent,  Pre-op evaluation,  At surgeon's request and post-op pain management  Laterality: Left  Prep: chloraprep       Needles:  Injection technique: Single-shot  Needle Type: Echogenic Stimulator Needle     Needle Length: 9cm  Needle Gauge: 21    Additional Needles:  Procedures: nerve stimulator Femoral nerve block  Nerve Stimulator or Paresthesia:  Response: Quadriceps muscle contraction, 0.45 mA,   Additional Responses:   Narrative:  Start time: 12/28/2010 7:00 AM End time: 12/28/2010 7:10 AM Injection made incrementally with aspirations every 5 mL.  Performed by: Personally  Anesthesiologist: Dr Chaney Malling  Additional Notes: Functioning IV was confirmed and monitors were applied.  A 90mm 21ga Arrow echogenic stimulator needle was used. Sterile prep and drape,hand hygiene and sterile gloves were used.  Negative aspiration and negative test dose prior to incremental administration of local anesthetic. The patient tolerated the procedure well.    Femoral nerve block Procedure Name: Intubation Date/Time: 12/28/2010 7:57 AM Performed by: Leona Singleton A. Oxygen Delivery Method: Circle System Utilized Preoxygenation: Pre-oxygenation with 100% oxygen Intubation Type: IV induction Ventilation: Oral airway inserted - appropriate to patient size and Mask ventilation with difficulty Laryngoscope Size: Miller and 3 Grade View: Grade I Tube type: Oral Tube size: 7.5 mm Number of attempts: 1 Airway Equipment and Method: stylet Placement Confirmation: ETT inserted through vocal cords under direct vision,  positive ETCO2 and breath sounds checked- equal and bilateral Secured at: 22 cm Tube secured with: Tape Dental Injury: Teeth and Oropharynx as per  pre-operative assessment  Comments: PreO2 x38mins. Smooth IV induction. #4 ProSeal LMA inserted. Pt laryngospasm. Anesthesia deepened. Unable to ventilate with LMA, LMA removed. Difficulty ventilating with OA and 2 hand mask. SpO2 remains > 88%. Pt paralyzed with Rocuronium. Ventilation easier. DL x1, Miller 3, Grade 1 view with clear oropharynx. AOI. +ETCO2, =BBS. VSS.

## 2010-12-28 NOTE — Progress Notes (Signed)
ANTICOAGULATION CONSULT NOTE - Initial Consult  Pharmacy Consult for Coumadin Indication: VTE prophylaxis  Allergies  Allergen Reactions  . Oxycodone-Acetaminophen     REACTION: hives    Patient Measurements: Height: 6' (182.9 cm) Weight: 175 lb (79.379 kg) IBW/kg (Calculated) : 77.6    Vital Signs: Temp: 97.3 F (36.3 C) (12/21 1521) BP: 157/89 mmHg (12/21 1521) Pulse Rate: 89  (12/21 1521)  Labs:  Basename 12/26/10 1008  HGB 14.8  HCT 43.7  PLT 193  APTT 31  LABPROT 13.2  INR 0.98  HEPARINUNFRC --  CREATININE 1.02  CKTOTAL --  CKMB --  TROPONINI --   Estimated Creatinine Clearance: 71.9 ml/min (by C-G formula based on Cr of 1.02).  Medical History: Past Medical History  Diagnosis Date  . Hyperlipidemia   . Pancreatitis   . Diverticulitis   . Allergy     hay fever  . GERD (gastroesophageal reflux disease)   . BPH (benign prostatic hyperplasia)   . Hiatal hernia     NO SURGERY FOR THIS  . Arthritis     Medications:  Prescriptions prior to admission  Medication Sig Dispense Refill  . aspirin 81 MG tablet Take 81 mg by mouth daily.        . rosuvastatin (CRESTOR) 20 MG tablet Take 1 tablet (20 mg total) by mouth at bedtime. Appointment needed for further refills.  30 tablet  11  . HYDROcodone-acetaminophen (NORCO) 5-325 MG per tablet Take 1 tablet by mouth every 8 (eight) hours as needed. For pain      . omeprazole (PRILOSEC) 20 MG capsule Take 20 mg by mouth daily.        Admit Complaint: end stage left knee OA-failed conservative treatment  Pharmacist System-Based Medication Review: Anticoagulation-coumadin Day#1 for DVT prophylaxis s/p L. TKA (Warfarin points score  = 4). INR goal 2-3 Infectious Disease-h/o diverticulitis , pancreatitis.  Afebrile, WBC 7.0 preop. Post op Ancef x 3 doses. Cardiovascular-HLD - f/u for restart of CRESTOR 20MG  QDAY. BP 157/89, P 89 Endocrinology- preop bld glucose wnl Gastrointestinal / Nutrition-h/o  diverticulitis Neurology-no issues noted  Pulmonary-R 16, O2 98% 2L [place] Hematology / Oncology-CBC wnl PTA Medication Issues--CRESTOR, PRILOSEC not resumed yet.  Best Practices-coumadin dvt  Px;  GERD: f/u for restart of PRILOSEC 20mg  daily.   Assessment: 70 y.o. Malewith a history of left knee osteoarthritis end-stage and history of GERD, hypercholesterolemia and history of idiopathic pancreatitis.  He is  s/p left total knee arthroplasy today. Starting Coumadin for DVT prophylaxis. Baseline INR 0.98, H/H 14.8/ 43.7, pltc 193K.    Goal of Therapy:  INR 2-3   Plan:  Coumadin 7.5mg  po tonight.  Monitor daily INR. We will educate patient on his coumadin therapy prior to discharge.    Arman Filter 12/28/2010,4:04 PM

## 2010-12-28 NOTE — Transfer of Care (Signed)
Immediate Anesthesia Transfer of Care Note  Patient: Thomas Pearson  Procedure(s) Performed:  TOTAL KNEE ARTHROPLASTY - left total knee - [id number]  Patient Location: PACU  Anesthesia Type: GA combined with regional for post-op pain  Level of Consciousness: awake, alert , oriented and patient cooperative  Airway & Oxygen Therapy: Patient Spontanous Breathing and Patient connected to nasal cannula oxygen  Post-op Assessment: Report given to PACU RN, Post -op Vital signs reviewed and stable and Patient moving all extremities X 4  Post vital signs: Reviewed and stable  Complications: No apparent anesthesia complications

## 2010-12-28 NOTE — H&P (View-Only) (Signed)
NAME: Thomas Pearson  MRN: #[id number] DATE: December 13, 2010 DOB: 09/20/1940  CHIEF COMPLAINT:  Followup osteoarthritis left knee.   HISTORY OF PRESENT ILLNESS:  Thomas Pearson is a 70 year old male with a history of left knee osteoarthritis end-stage.  He also has GERD, hypercholesterolemia and history of idiopathic pancreatitis. He has failed conservative treatment with oral anti-inflammatories, viscosupplementation and corticosteroid injections.  He has previous x-rays documenting bone-on-bone arthritis of left knee.  It is significantly affecting his quality of life and ADLs.  He has the most pain when weightbearing and it occasionally awakens him from sleep at night.  Risks and benefits of total knee arthroplasty were discussed at previous visits and he is here today for consideration of this.  He has obtained medical clearance from his primary care physician, Dr. Alroy Dust.     REVIEW OF SYSTEMS: Positive for glasses, contacts, ringing in the ears, dentures, diarrhea, constipation, hemorrhoids and nosebleeds.   PAST MEDICAL HISTORY:  GERD, hypercholesterolemia and pancreatitis 2009.   PAST SURGICAL HISTORY:  Circumcision 1954, history of prostate procedure, and TURP 2001.   CURRENT MEDICATIONS:  Omeprazole 20 mg daily, Crestor 20 mg one at bedtime, 81 mg aspirin daily, stool softener 100 mg morning and night.  ALLERGIES:  He states he has a drug allergy to Percocet which caused hives in the past.  He has tolerated hydrocodone.   FAMILY HISTORY: Positive for heart attack in his father, diabetes in a sister, cancer in his brother and high blood pressure in his child.   SOCIAL HISTORY:  The patient is a past smoker; pack per day for 10 years; stopped in 1967.  He does not drink alcohol.  He is widowed and retired from [employer].  He has one living child that he lives with and lives in a two-story residence.    EXAMINATION:  The patient is seated in the exam room in no  acute distress. He is alert and oriented and appears appropriate age.  Height 6 foot and weight 175 pounds; BMI calculated at 23.7.  Vital signs:  Temperature 97.3; pulse 61; respiration 18; blood pressure 151/87. HEENT:  Pupils are equal and reactive to light.  He has full upper and lower dentures. Neck is supple with good range of motion.   Chest and lungs are clear to auscultation bilaterally.  Cardiac exam is regular rate and rhythm; no obvious murmur. Abdomen is soft and nontender; active bowel sounds in all 4 quadrants. Cranial nerves are grossly intact. The skin has no rashes or lesions. Rectal exam not indicated for surgery. Examination of the left lower extremity shows he is neurovascularly intact. He has tenderness to palpation along the medial joint line and positive patellofemoral crepitus. He has painful range of motion and ambulates with an antalgic gait favoring the left leg.   IMAGING:  No images were obtained today.  X-rays of left knee taken 10/15/2010 show a varus knee with severe medial compartment narrowing and patellofemoral spurring; bone-on-bone arthritis.   ASSESSMENT:   1. End-stage osteoarthritis left knee. 2. Hypercholesterolemia. 3. GERD.   PLAN:  Risks and benefits of total knee replacement were discussed with the patient and he wishes to proceed.  He has been scheduled for surgery on 12/28/2010 and scheduled for preadmission testing on 12/17/2010.  He has obtained his medical clearance.  The patient was given preoperative instructions and will be placed on Lovenox postoperatively for DVT prophylaxis.  He will be given perioperative antibiotics, Ancef.  He  is planning to go home following the surgery pending clearance from inpatient therapy.  Home PT has been arranged with [facility].    Josh Yilin Weedon, P.A.-C/[id number]  Auto-Authenticated by Estanislado Spire, P.A.-C

## 2010-12-28 NOTE — Op Note (Signed)
Dictated [phone]

## 2010-12-28 NOTE — Preoperative (Signed)
Beta Blockers   Reason not to administer Beta Blockers:Not Applicable 

## 2010-12-28 NOTE — Anesthesia Postprocedure Evaluation (Signed)
Anesthesia Post Note  Patient: Thomas Pearson  Procedure(s) Performed:  TOTAL KNEE ARTHROPLASTY - left total knee - [phone]  Anesthesia type: General  Patient location: PACU  Post pain: Pain level controlled and Adequate analgesia  Post assessment: Post-op Vital signs reviewed, Patient's Cardiovascular Status Stable, Respiratory Function Stable, Patent Airway and Pain level controlled  Last Vitals:  Filed Vitals:   12/28/10 1145  Temp: 36.7 C    Post vital signs: Reviewed and stable  Level of consciousness: awake, alert  and oriented  Complications: No apparent anesthesia complications

## 2010-12-29 LAB — CBC
HCT: 35.1 % — ABNORMAL LOW (ref 39.0–52.0)
Hemoglobin: 11.6 g/dL — ABNORMAL LOW (ref 13.0–17.0)
MCH: 29.1 pg (ref 26.0–34.0)
MCHC: 33 g/dL (ref 30.0–36.0)
Platelets: 171 10*3/uL (ref 150–400)

## 2010-12-29 LAB — BASIC METABOLIC PANEL
CO2: 27 mEq/L (ref 19–32)
Chloride: 101 mEq/L (ref 96–112)
Creatinine, Ser: 0.86 mg/dL (ref 0.50–1.35)
GFR calc Af Amer: >90 mL/min (ref >90)
Potassium: 3.6 mEq/L (ref 3.5–5.1)
Sodium: 135 mEq/L (ref 135–145)

## 2010-12-29 LAB — PROTIME-INR: Prothrombin Time: 14.7 seconds (ref 11.6–15.2)

## 2010-12-29 MED ORDER — ROSUVASTATIN CALCIUM 20 MG PO TABS
20.0000 mg | ORAL_TABLET | Freq: Every day | ORAL | Status: DC
  Administered 2010-12-29: 20 mg via ORAL
  Filled 2010-12-29 (×2): qty 1

## 2010-12-29 MED ORDER — HYDROCODONE-ACETAMINOPHEN 5-325 MG PO TABS
1.0000 | ORAL_TABLET | Freq: Four times a day (QID) | ORAL | Status: DC | PRN
  Administered 2010-12-29 – 2010-12-30 (×4): 2 via ORAL
  Filled 2010-12-29 (×4): qty 2

## 2010-12-29 MED ORDER — PANTOPRAZOLE SODIUM 40 MG PO TBEC
40.0000 mg | DELAYED_RELEASE_TABLET | Freq: Every day | ORAL | Status: DC
  Administered 2010-12-29 – 2010-12-30 (×2): 40 mg via ORAL
  Filled 2010-12-29 (×2): qty 1

## 2010-12-29 MED ORDER — WARFARIN SODIUM 7.5 MG PO TABS
7.5000 mg | ORAL_TABLET | Freq: Once | ORAL | Status: AC
  Administered 2010-12-29: 7.5 mg via ORAL
  Filled 2010-12-29: qty 1

## 2010-12-29 MED ORDER — ASPIRIN 81 MG PO TABS: 81.0000 mg | ORAL_TABLET | Freq: Every day | ORAL | Status: DC

## 2010-12-29 MED ORDER — MORPHINE SULFATE 2 MG/ML IJ SOLN: 1.0000 mg | INTRAMUSCULAR | Status: DC | PRN

## 2010-12-29 MED ORDER — ASPIRIN 81 MG PO CHEW
81.0000 mg | CHEWABLE_TABLET | Freq: Every day | ORAL | Status: DC
  Administered 2010-12-30: 81 mg via ORAL
  Filled 2010-12-29: qty 1

## 2010-12-29 NOTE — Progress Notes (Signed)
Physical Therapy Evaluation Patient Details Name: Thomas Pearson MRN: [id number] DOB: December 31, 1940 Today's Date: 12/29/2010  Problem List:  Patient Active Problem List  Diagnoses  . COLONIC POLYPS  . HYPERCHOLESTEROLEMIA  . ALLERGIC RHINITIS  . ESOPHAGITIS, REFLUX  . HIATAL HERNIA  . DIVERTICULOSIS, COLON  . ACUTE PANCREATITIS  . DEGENERATIVE JOINT DISEASE  . DEGENERATIVE DISC DISEASE, CERVICAL SPINE  . DIZZINESS  . ELEVATED PROSTATE SPECIFIC ANTIGEN  . ABNORMAL ELECTROCARDIOGRAM  . BENIGN PROSTATIC HYPERTROPHY, HX OF  . Diverticulitis of colon (without mention of hemorrhage)  . Personal history of colonic polyps    Past Medical History:  Past Medical History  Diagnosis Date  . Hyperlipidemia   . Pancreatitis   . Diverticulitis   . Allergy     hay fever  . GERD (gastroesophageal reflux disease)   . BPH (benign prostatic hyperplasia)   . Hiatal hernia     NO SURGERY FOR THIS  . Arthritis    Past Surgical History:  Past Surgical History  Procedure Date  . Prostate surgery     PT Assessment/Plan/Recommendation PT Assessment Clinical Impression Statement: Pt presents with a medical diagnosis of Left TKA along with the following impairments/deficits and therapy diagnosis listed below. Pt will benefit from skilled PT in the acute care setting in order to maximize functional mobility for a safe d/c home PT Recommendation/Assessment: Patient will need skilled PT in the acute care venue PT Problem List: Decreased strength;Decreased range of motion;Decreased activity tolerance;Decreased mobility;Decreased knowledge of use of DME;Decreased knowledge of precautions;Pain PT Therapy Diagnosis : Difficulty walking;Acute pain PT Plan PT Frequency: 7X/week PT Treatment/Interventions: DME instruction;Gait training;Stair training;Functional mobility training;Therapeutic activities;Therapeutic exercise;Patient/family education PT Recommendation Follow Up Recommendations: Home  health PT;24 hour supervision/assistance Equipment Recommended: None recommended by PT PT Goals  Acute Rehab PT Goals PT Goal Formulation: With patient Time For Goal Achievement: 7 days Pt will go Supine/Side to Sit: with modified independence PT Goal: Supine/Side to Sit - Progress: Progressing toward goal Pt will go Sit to Stand: with modified independence PT Goal: Sit to Stand - Progress: Progressing toward goal Pt will go Stand to Sit: with modified independence PT Goal: Stand to Sit - Progress: Progressing toward goal Pt will Ambulate: >150 feet;with modified independence;with rolling walker PT Goal: Ambulate - Progress: Progressing toward goal Pt will Go Up / Down Stairs: 1-2 stairs;with rail(s);with supervision PT Goal: Up/Down Stairs - Progress: Other (comment) (Not tested today) Pt will Perform Home Exercise Program: Independently PT Goal: Perform Home Exercise Program - Progress: Progressing toward goal  PT Evaluation Precautions/Restrictions  Restrictions Weight Bearing Restrictions: Yes LLE Weight Bearing: Weight bearing as tolerated Prior Functioning  Home Living Lives With: [name] Help From: Family Type of Home: House Home Layout: Two level;Able to live on main level with bedroom/bathroom Alternate Level Stairs-Rails:  (lift chair) Alternate Level Stairs-Number of Steps: flight Home Access: Stairs to enter Entrance Stairs-Rails: Left Entrance Stairs-Number of Steps: 1 Bathroom Shower/Tub: Walk-in shower;Door [employer] Toilet: Standard Bathroom Accessibility: Yes How Accessible: Accessible via walker Home Adaptive Equipment: Bedside commode/3-in-1;Walker - rolling;Grab bars in shower;Shower chair with back Prior Function Level of Independence: Independent with basic ADLs;Independent with gait;Independent with transfers;Independent with homemaking with ambulation Able to Take Stairs?: Yes Driving: Yes Vocation:  Retired Producer, television/film/video: Awake/alert Overall Cognitive Status: Appears within functional limits for tasks assessed Orientation Level: Oriented X4 Sensation/Coordination Sensation Light Touch: Impaired Detail Light Touch Impaired Details: Impaired LLE (decreased sensation around nerve block) Extremity Assessment RLE Assessment RLE  Assessment: Within Functional Limits LLE Assessment LLE Assessment: Exceptions to WFL LLE AROM (degrees) Overall AROM Left Lower Extremity: Deficits;Due to pain (Hip and Ankle WFL; Knee -5-60 degrees) LLE Strength LLE Overall Strength: Deficits;Due to pain Mobility (including Balance) Bed Mobility Bed Mobility: Yes Supine to Sit: 4: Min assist;HOB elevated (Comment degrees);With rails (30 degrees) Supine to Sit Details (indicate cue type and reason): Assist with LLE into sitting. Pt able to control trunk without physical assist. VC for sequencing Sitting - Scoot to Edge of Bed: 5: Supervision Sitting - Scoot to Edge of Bed Details (indicate cue type and reason): VC for sequencing and hand placement Transfers Transfers: Yes Sit to Stand: 4: Min assist;From bed;With upper extremity assist Sit to Stand Details (indicate cue type and reason): VC for sequencing and hand placement for safety to RW Stand to Sit: 4: Min assist;With upper extremity assist;To chair/3-in-1 Stand to Sit Details: VC for hand placement and sequencing for safety into sitting. Pt able to control descent. Ambulation/Gait Ambulation/Gait: Yes Ambulation/Gait Assistance: 4: Min assist Ambulation/Gait Assistance Details (indicate cue type and reason): VC for sequencing and safety to RW. Pt with difficulty with extension on LLE and stability.  Ambulation Distance (Feet): 50 Feet Assistive device: Rolling walker Gait Pattern: Left flexed knee in stance;Decreased weight shift to left;Decreased stance time - left;Decreased step length - right;Step-to pattern;Trunk  flexed Gait velocity: Decreased gait speed Stairs: No    Exercise  Total Joint Exercises Ankle Circles/Pumps: AROM;Strengthening;Both;10 reps;Supine Quad Sets: AROM;Left;Strengthening;10 reps;Supine Straight Leg Raises: Strengthening;AROM;Left;10 reps;Supine;Other (comment) (with knee immobilizer ) End of Session PT - End of Session Equipment Utilized During Treatment: Gait belt;Left knee immobilizer Activity Tolerance: Patient tolerated treatment well Patient left: in chair;with call bell in reach Nurse Communication: Mobility status for transfers;Mobility status for ambulation General Behavior During Session: [facility] for tasks performed Cognition: [facility] for tasks performed  Milana Kidney 12/29/2010, 9:15 AM  12/29/2010 Milana Kidney DPT PAGER: [phone] OFFICE: [phone]

## 2010-12-29 NOTE — Progress Notes (Signed)
Subjective: 1 Day Post-Op Procedure(s) (LRB): TOTAL KNEE ARTHROPLASTY (Left)   Doing great Pain controlled pretty well Was in CPM last night Not in KI this am Denies CP, no SOB No N/V/D Denies numbness or tingling  Objective: Current Vitals Blood pressure 123/58, pulse 73, temperature 98.4 F (36.9 C), temperature source Oral, resp. rate 18, height 6' (1.829 m), weight 79.379 kg (175 lb), SpO2 100.00%. Vital signs in last 24 hours: Temp:  [97.3 F (36.3 C)-98.4 F (36.9 C)] 98.4 F (36.9 C) (12/22 0605) Pulse Rate:  [52-89] 73  (12/22 0605) Resp:  [13-25] 18  (12/22 0605) BP: (115-175)/(58-90) 123/58 mmHg (12/22 0605) SpO2:  [92 %-100 %] 100 % (12/22 0605) Weight:  [79.379 kg (175 lb)] 175 lb (79.379 kg) (12/21 1530)  Intake/Output from previous day: 12/21 0701 - 12/22 0700 In: 3140 [P.O.:440; I.V.:1800; IV Piggyback:900] Out: 2800 [Urine:1800; Drains:450; Blood:200]  LABS  Basename 12/29/10 0530 12/26/10 1008  HGB 11.6* 14.8    Basename 12/29/10 0530 12/26/10 1008  WBC 8.4 7.0  RBC 3.98* 4.97  HCT 35.1* 43.7  PLT 171 193    Basename 12/29/10 0530 12/26/10 1008  NA 135 141  K 3.6 4.0  CL 101 106  CO2 27 28  BUN 15 20  CREATININE 0.86 1.02  GLUCOSE 194* 114*  CALCIUM 8.3* 9.5    Basename 12/29/10 0530 12/26/10 1008  LABPT -- --  INR 1.13 0.98      Physical Exam  Gen:NAD Lungs:Clear Cardiac:Reg Abd:+ BS Ext: Left Lower Extremity  Dressing C/D/I  Knee in flexed position  Passively extended to within 5 degrees of terminal extension  Distal motor and sensory functions intact  No DCT  Compartments soft and NT  + DP pulse  Swelling controlled   Imaging X-ray Knee Left Port  12/28/2010  *RADIOLOGY REPORT*  Clinical Data: Postop arthroplasty.  PORTABLE LEFT KNEE - 1-2 VIEW  Comparison: None.  Findings: Left total knee replacement.  Surgical drain good position.  Satisfactory position and alignment.  IMPRESSION: As above.  Original Report  Authenticated By: Elsie Stain, M.D.    Assessment/Plan: 1 Day Post-Op Procedure(s) (LRB): TOTAL KNEE ARTHROPLASTY (Left)  70 y/o male s/p L TKA POD #1  1. L TKA  WBAT  CPM  PT/OT  NO PILLOWS UNDER KNEE/Total knee precautions  Ice prn  Elevate  dsg change tomorrow 2. Hyperlipidemia  Home meds 3. GERD  Prophylaxis 4. FEN  Diet as tolerated 5. DVT/PE prophylaxis  Lovenox bridge to coumadin 6. dispo  PT/OT  Possible d/c tomorrow or sunday    Mearl Latin, PA-C 12/29/2010, 8:32 AM

## 2010-12-29 NOTE — Op Note (Signed)
NAME:  Thomas Pearson, Thomas Pearson NO.:  [id number]  MEDICAL RECORD NO.:  [id number]  LOCATION:  5036                         FACILITY:  [facility]  PHYSICIAN:  Dyke Brackett, M.D.    DATE OF BIRTH:  [birth year]  DATE OF PROCEDURE: DATE OF DISCHARGE:                              OPERATIVE REPORT   PREOPERATIVE DIAGNOSIS:  Osteoarthritis, left knee with severe varus deformity.  POSTOPERATIVE DIAGNOSIS:  Osteoarthritis, left knee with severe varus deformity.  OPERATION:  Left total knee replacement (Sigma cemented DePuy knee size 4 tibia, femur 12.5-mm bearing with 3 PEG all-poly 38 mm patella).  SURGEON:  Dyke Brackett, M.D.  ASSISTANTLaural Benes. Su Hilt, PA  TOURNIQUET TIME:  1 hours 30 minutes.  DESCRIPTION OF PROCEDURE:  Sterile prep and drape, exsanguination of leg, inflation to 350, straight skin incision medial parapatellar approach to the knee made.  Extreme varus deformity was noted with tibial bowing __________ medial stripping.  We resected 10 mm of the distal femur in a 5-degree valgus inclination.  We did a provisional tibia resection a second tubular resection in order to obtain an adequate extension gap with the external guide setting the appropriate amount of valgus.  Once these 2 cuts were made, we measured the extension gap at 12-5 mm.  We then sized the femur to be a size 4 femur and created the pin holes for the cutting blocks on the femur setting the rotation and the flexion gap.  We then placed the 3:1 cutting block for the anterior-posterior cuts and chamfers and resected bone and matched the flexion gap at 12.5. We did have to do a moderate amount of stripping posteromedially, stripping posterior capsule, releasing the PCL to symmetrically balance the gaps mediolaterally and eventually this was accomplished.  We cut a keel hole of the  conventional tibia followed by trial tibia using the trial.  We then trialed the femur after the box cut was  made for the femur, and we obtained full extension on the table with good stability to varus or valgus and tendency for bearing spin out.  The patella was cut leaving about 15 mm of native patella for 3 PEG all-poly patella trial.  All trial parameters were again deemed to be acceptable.  Trial components were removed.  We then prepared the cement on the back table.  Cement was inserted on the tibia followed by femur, patella.  We then cemented all components in with the exception of a trial bearing. Then, just the parameters again to be acceptable, removed the bearing. No excess cement was noted at the back of the knee.  We then released the tourniquet without the bearing.  No excess bleeding was noted. Small bleeders were coagulated.  Hemovac drain was placed exiting superolaterally.  Then, we closed with interrupted Ethibond, 2-0 Vicryl, and skin clips.  Lightly compressive sterile dressing was applied.  Taken to recovery room in stable condition.     Dyke Brackett, M.D.     WDC/MEDQ  D:  12/28/2010  T:  12/29/2010  Job:  [id number]

## 2010-12-29 NOTE — Progress Notes (Signed)
ANTICOAGULATION CONSULT NOTE - Follow up Consult  Pharmacy Consult for Coumadin Indication: VTE prophylaxis  Assessment: 70 y.o. M with a history of left knee osteoarthritis end-stage  s/p left total knee arthroplasy, POD#1, continuing Coumadin for DVT prophylaxis. Baseline INR 0.98, INR today subtherapeutic at 1.13 but increasing towards goal. Decrease in hgb to 11.6 (s/p surgery), plt stable. No bleeding noted.  Goal of Therapy:  INR 2-3   Plan:   1. Coumadin 7.5mg  po x1 tonight.  2. Monitor daily INR and CBC    Allergies  Allergen Reactions  . Oxycodone-Acetaminophen     REACTION: hives    Patient Measurements: Height: 6' (182.9 cm) Weight: 175 lb (79.379 kg) IBW/kg (Calculated) : 77.6    Vital Signs: Temp: 98.4 F (36.9 C) (12/22 0605) Temp src: Oral (12/22 0605) BP: 123/58 mmHg (12/22 0605) Pulse Rate: 73  (12/22 0605)  Labs:  Basename 12/29/10 0530  HGB 11.6*  HCT 35.1*  PLT 171  APTT --  LABPROT 14.7  INR 1.13  HEPARINUNFRC --  CREATININE 0.86  CKTOTAL --  CKMB --  TROPONINI --   Estimated Creatinine Clearance: 85.2 ml/min (by C-G formula based on Cr of 0.86).  Medical History: Past Medical History  Diagnosis Date  . Hyperlipidemia   . Pancreatitis   . Diverticulitis   . Allergy     hay fever  . GERD (gastroesophageal reflux disease)   . BPH (benign prostatic hyperplasia)   . Hiatal hernia     NO SURGERY FOR THIS  . Arthritis     Medications:  Prescriptions prior to admission  Medication Sig Dispense Refill  . aspirin 81 MG tablet Take 81 mg by mouth daily.        . rosuvastatin (CRESTOR) 20 MG tablet Take 1 tablet (20 mg total) by mouth at bedtime. Appointment needed for further refills.  30 tablet  11  . HYDROcodone-acetaminophen (NORCO) 5-325 MG per tablet Take 1 tablet by mouth every 8 (eight) hours as needed. For pain      . omeprazole (PRILOSEC) 20 MG capsule Take 20 mg by mouth daily.        Admit Complaint: end stage left knee  OA-failed conservative treatment  Pharmacist System-Based Medication Review: Anticoagulation-coumadin Day#2 for DVT prophylaxis s/p L TKA (Warfarin points score  = 5). INR goal 2-3 Infectious Disease-h/o diverticulitis , pancreatitis.  Afebrile, WBC 8.4. Post op Ancef x 3 doses.  Cardiovascular-HLD - ASA 81 mg, rosuvastatin 20 mg qhs resumed. BP 123/58, P 73 Endocrinology- preop bld glucose wnl Gastrointestinal / Nutrition-h/o diverticulitis, GERD: IV pantoprazole; bowel regimen Neurology-no issues noted, Norco/IV morphine for pain Pulmonary-O2 2L [place] Hematology / Oncology-Hgb decreased, plt stable, f/u hgb Best Practices-coumadin VTE ppx (INR subtherapeutic today), enoxaparin 30 mg q12h VTE ppx, IV PPI. Home medications resumed/addressed.      Concha Norway 12/29/2010,10:37 AM

## 2010-12-30 LAB — CBC
HCT: 31.4 % — ABNORMAL LOW (ref 39.0–52.0)
Platelets: 158 10*3/uL (ref 150–400)
RDW: 12.4 % (ref 11.5–15.5)
WBC: 9.4 10*3/uL (ref 4.0–10.5)

## 2010-12-30 LAB — PROTIME-INR
INR: 1.45 (ref 0.00–1.49)
Prothrombin Time: 17.9 seconds — ABNORMAL HIGH (ref 11.6–15.2)

## 2010-12-30 MED ORDER — METHOCARBAMOL 500 MG PO TABS: 500.0000 mg | ORAL_TABLET | Freq: Four times a day (QID) | ORAL | Status: AC | PRN

## 2010-12-30 MED ORDER — ENOXAPARIN SODIUM 30 MG/0.3ML ~~LOC~~ SOLN: 30.0000 mg | Freq: Two times a day (BID) | SUBCUTANEOUS | Status: DC

## 2010-12-30 MED ORDER — WARFARIN SODIUM 5 MG PO TABS
5.0000 mg | ORAL_TABLET | Freq: Every day | ORAL | Status: DC
  Filled 2010-12-30: qty 1

## 2010-12-30 MED ORDER — WARFARIN SODIUM 5 MG PO TABS: 5.0000 mg | ORAL_TABLET | Freq: Every day | ORAL | Status: DC

## 2010-12-30 MED ORDER — DSS 100 MG PO CAPS: 100.0000 mg | ORAL_CAPSULE | Freq: Two times a day (BID) | ORAL | Status: AC

## 2010-12-30 NOTE — Progress Notes (Signed)
Physical Therapy Treatment Patient Details Name: Thomas Pearson MRN: [id number] DOB: 07-Aug-1940 Today's Date: 12/30/2010  PT Assessment/Plan  PT - Assessment/Plan Comments on Treatment Session: Pt progressing well, he is at a mod independence-supervision level with all mobility and min assist for stairs. Pt still has difficulty with full extension of knee. Educated pt on importance of maintiaing knee extension throughout the day and ways to achieve this.  PT Plan: Discharge plan remains appropriate;Frequency remains appropriate PT Frequency: 7X/week Follow Up Recommendations: Home health PT;24 hour supervision/assistance Equipment Recommended: None recommended by PT PT Goals  Acute Rehab PT Goals PT Goal Formulation: With patient PT Goal: Supine/Side to Sit - Progress: Met PT Goal: Sit to Stand - Progress: Met PT Goal: Stand to Sit - Progress: Progressing toward goal PT Goal: Ambulate - Progress: Progressing toward goal PT Goal: Up/Down Stairs - Progress: Progressing toward goal PT Goal: Perform Home Exercise Program - Progress: Progressing toward goal  PT Treatment Precautions/Restrictions  Restrictions Weight Bearing Restrictions: Yes LLE Weight Bearing: Weight bearing as tolerated Mobility (including Balance) Bed Mobility Bed Mobility: Yes Supine to Sit: 6: Modified independent (Device/Increase time);HOB elevated (Comment degrees) (20) Sitting - Scoot to Edge of Bed: 6: Modified independent (Device/Increase time) Transfers Transfers: Yes Sit to Stand: 5: Supervision;From bed;With upper extremity assist Sit to Stand Details (indicate cue type and reason): VC for hand placement. Supervision for safety and stability into standing Stand to Sit: 6: Modified independent (Device/Increase time);With upper extremity assist;To chair/3-in-1 Ambulation/Gait Ambulation/Gait: Yes Ambulation/Gait Assistance: 5: Supervision Ambulation/Gait Assistance Details (indicate cue type and  reason): VC for increased heel strike and knee extension during stance phase. Pt with increased stride length this session and gait speed Ambulation Distance (Feet): 200 Feet Assistive device: Rolling walker Gait Pattern: Left flexed knee in stance;Decreased weight shift to left;Trunk flexed;Step-through pattern Gait velocity: Normal gait speed Stairs: Yes Stairs Assistance: 4: Min assist Stairs Assistance Details (indicate cue type and reason): VC for proper technique and stair sequencing with RW as well as safety cues throughout.  Stair Management Technique: Backwards;With walker Number of Stairs: 3     Exercise  Total Joint Exercises Heel Slides: AAROM;Strengthening;Left;10 reps;Seated Hip ABduction/ADduction: AROM;Strengthening;Left;10 reps;Supine Straight Leg Raises: Strengthening;AROM;Left;10 reps;Supine;Other (comment) Long Arc Quad: AAROM;Strengthening;Left;10 reps (assist at end for full extension(still -5)) End of Session PT - End of Session Equipment Utilized During Treatment: Gait belt;Left knee immobilizer Activity Tolerance: Patient tolerated treatment well Patient left: in chair;with call bell in reach Nurse Communication: Mobility status for transfers;Mobility status for ambulation General Behavior During Session: [facility] for tasks performed Cognition: [facility] for tasks performed  Milana Kidney 12/30/2010, 10:49 AM  12/30/2010 Milana Kidney DPT PAGER: [phone] OFFICE: [phone]

## 2010-12-30 NOTE — Progress Notes (Signed)
OT SIGN OFF  Patient is being discharged from PT/ OT/ SLP services secondary to:  Goals met and no further therapy needs identified.   Progress and discharge plan and discussed with patient/caregiver and they  Agree    Lucile Shutters   OTR/L Pager: [phone] Office: [phone] .

## 2010-12-30 NOTE — Progress Notes (Signed)
Occupational Therapy Evaluation Patient Details Name: Thomas Pearson MRN: [id number] DOB: 1940-08-25 Today's Date: 12/30/2010  Problem List:  Patient Active Problem List  Diagnoses  . COLONIC POLYPS  . HYPERCHOLESTEROLEMIA  . ALLERGIC RHINITIS  . ESOPHAGITIS, REFLUX  . HIATAL HERNIA  . DIVERTICULOSIS, COLON  . ACUTE PANCREATITIS  . DEGENERATIVE JOINT DISEASE  . DEGENERATIVE DISC DISEASE, CERVICAL SPINE  . DIZZINESS  . ELEVATED PROSTATE SPECIFIC ANTIGEN  . ABNORMAL ELECTROCARDIOGRAM  . BENIGN PROSTATIC HYPERTROPHY, HX OF  . Diverticulitis of colon (without mention of hemorrhage)  . Personal history of colonic polyps    Past Medical History:  Past Medical History  Diagnosis Date  . Hyperlipidemia   . Pancreatitis   . Diverticulitis   . Allergy     hay fever  . GERD (gastroesophageal reflux disease)   . BPH (benign prostatic hyperplasia)   . Hiatal hernia     NO SURGERY FOR THIS  . Arthritis    Past Surgical History:  Past Surgical History  Procedure Date  . Prostate surgery     OT Assessment/Plan/Recommendation OT Assessment OT Recommendation/Assessment: Patient does not need any further OT services OT Recommendation Equipment Recommended: None recommended by OT OT Goals    OT Evaluation Precautions/Restrictions  Restrictions Weight Bearing Restrictions: Yes LLE Weight Bearing: Weight bearing as tolerated Prior Functioning Home Living Lives With: [name] Help From: Family Type of Home: House Home Layout: Two level;Able to live on main level with bedroom/bathroom Alternate Level Stairs-Number of Steps: flight Home Access: Stairs to enter Entrance Stairs-Rails: Left Entrance Stairs-Number of Steps: 1 Bathroom Shower/Tub: Walk-in shower;Door [employer] Toilet: Standard Bathroom Accessibility: Yes How Accessible: Accessible via walker Home Adaptive Equipment: Bedside commode/3-in-1;Walker - rolling;Grab bars in shower;Shower chair with  back Prior Function Level of Independence: Independent with basic ADLs;Independent with gait;Independent with transfers;Independent with homemaking with ambulation Able to Take Stairs?: Yes Driving: Yes Vocation: Retired Leisure: Hobbies-yes (Comment) (plays golf 3-4 times a week) ADL ADL Eating/Feeding: Performed;Modified independent Where Assessed - Eating/Feeding: Edge of bed Lower Body Dressing: Performed;Set up (don pants with reacher. Pt has reacher at home) Lower Body Dressing Details (indicate cue type and reason): pt educated on dressing operated leg first and how to use reacher to make dressing an easier task. Where Assessed - Lower Body Dressing: Sit to stand from bed Equipment Used: Reacher;Rolling walker ADL Comments: Pt currently ambulating to rest room Mod I. pt completed steps with PT demonstrating ability to complete shower transfer. Pt educated on using Rt LE to A Lt Le for bed mobility. pt completed bed mobility MOD I with Hob <20 degrees no rails. Vision/Perception    Cognition Cognition Arousal/Alertness: Awake/alert Overall Cognitive Status: Appears within functional limits for tasks assessed Orientation Level: Oriented X4 Sensation/Coordination Coordination Gross Motor Movements are Fluid and Coordinated: Yes Fine Motor Movements are Fluid and Coordinated: Yes Extremity Assessment RUE Assessment RUE Assessment: Within Functional Limits LUE Assessment LUE Assessment: Within Functional Limits Mobility  Bed Mobility Bed Mobility: Yes Supine to Sit: 6: Modified independent (Device/Increase time) Sitting - Scoot to Edge of Bed: 6: Modified independent (Device/Increase time) Transfers Transfers: Yes Sit to Stand: 5: Supervision;From bed;With upper extremity assist Sit to Stand Details (indicate cue type and reason): VC for hand placement. Supervision for safety and stability into standing Stand to Sit: 5: Supervision;With upper extremity assist;To  bed Exercises Total Joint Exercises Heel Slides: AAROM;Strengthening;Left;10 reps;Seated Hip ABduction/ADduction: AROM;Strengthening;Left;10 reps;Supine Straight Leg Raises: Strengthening;AROM;Left;10 reps;Supine;Other (comment) Long Arc Quad: AAROM;Strengthening;Left;10 reps (assist  at end for full extension(still -5)) End of Session OT - End of Session Activity Tolerance: Patient tolerated treatment well Patient left: in bed;with call bell in reach Nurse Communication: Mobility status for transfers General Behavior During Session: [facility] for tasks performed Cognition: [facility] for tasks performed    Lucile Shutters 12/30/2010, 11:05 AM  Pager: [phone]

## 2010-12-30 NOTE — Progress Notes (Signed)
Subjective: 2 Days Post-Op Procedure(s) (LRB): TOTAL KNEE ARTHROPLASTY (Left)  Doing great Ready to go home Pain controlled Cleared by PT  Objective: Current Vitals Blood pressure 131/74, pulse 81, temperature 99.7 F (37.6 C), temperature source Oral, resp. rate 18, height 6' (1.829 m), weight 79.379 kg (175 lb), SpO2 95.00%. Vital signs in last 24 hours: Temp:  [99.3 F (37.4 C)-99.7 F (37.6 C)] 99.7 F (37.6 C) (12/23 0521) Pulse Rate:  [69-81] 81  (12/23 0521) Resp:  [18-20] 18  (12/23 0521) BP: (125-132)/(59-75) 131/74 mmHg (12/23 0521) SpO2:  [95 %-99 %] 95 % (12/23 0521)  Intake/Output from previous day: 12/22 0701 - 12/23 0700 In: 1647 [P.O.:1440; I.V.:207] Out: 1500 [Urine:1225; Drains:275]  LABS  Basename 12/30/10 0700 12/29/10 0530  HGB 10.5* 11.6*    Basename 12/30/10 0700 12/29/10 0530  WBC 9.4 8.4  RBC 3.57* 3.98*  HCT 31.4* 35.1*  PLT 158 171    Basename 12/29/10 0530  NA 135  K 3.6  CL 101  CO2 27  BUN 15  CREATININE 0.86  GLUCOSE 194*  CALCIUM 8.3*    Basename 12/30/10 0700 12/29/10 0530  LABPT -- --  INR 1.45 1.13    Physical Exam  Gen: No acute distress Lungs: Clear Cardiac: Regular Ext: Left lower extremity  Incision and plastic  Drain removed today  Swelling control  Distal motor and sensory functions are intact  No deep calf tenderness is noted  Extremity is warm  Palpable dorsalis pedis pulse   Assessment/Plan: 2 Days Post-Op Procedure(s) (LRB): TOTAL KNEE ARTHROPLASTY (Left)  70 y/o male s/p L TKA POD #2  1. L TKA   WBAT   CPM   PT/OT   NO PILLOWS UNDER KNEE/Total knee precautions   Ice prn   Elevate   dsg changed  2. Hyperlipidemia   Home meds  3. GERD   Prophylaxis  4. FEN   Diet as tolerated  5. DVT/PE prophylaxis   Lovenox bridge to coumadin  6. dispo   PT/OT   DC today with home health  Mearl Latin, PA-C 12/30/2010, 9:28 AM

## 2010-12-30 NOTE — Progress Notes (Signed)
ANTICOAGULATION CONSULT NOTE - Follow up Consult  Pharmacy Consult for Coumadin Indication: VTE prophylaxis  Assessment: 70 y.o. M with a history of left knee osteoarthritis end-stage  s/p left total knee arthroplasy, POD#2, continuing Coumadin for DVT prophylaxis. Also on enoxaparin 30 mg SQ q12h. INR today remains subtherapeutic at 1.45 but appropriately increasing towards goal. Decrease in hgb to 10.5 (s/p surgery), plt stable. Plan to d/c home with home health today.  Goal of Therapy:  INR 2-3   Plan:   1. Coumadin 5mg  po daily written for d/c.  2. Monitor daily INR and CBC if still here in AM or f/u with home health.    Allergies  Allergen Reactions  . Oxycodone-Acetaminophen     REACTION: hives    Patient Measurements: Height: 6' (182.9 cm) Weight: 175 lb (79.379 kg) IBW/kg (Calculated) : 77.6    Vital Signs: Temp: 99.7 F (37.6 C) (12/23 0521) Temp src: Oral (12/23 0521) BP: 131/74 mmHg (12/23 0521) Pulse Rate: 81  (12/23 0521)  Labs:  Basename 12/30/10 0700 12/29/10 0530  HGB 10.5* 11.6*  HCT 31.4* 35.1*  PLT 158 171  APTT -- --  LABPROT 17.9* 14.7  INR 1.45 1.13  HEPARINUNFRC -- --  CREATININE -- 0.86  CKTOTAL -- --  CKMB -- --  TROPONINI -- --   Estimated Creatinine Clearance: 85.2 ml/min (by C-G formula based on Cr of 0.86).  Medical History: Past Medical History  Diagnosis Date  . Hyperlipidemia   . Pancreatitis   . Diverticulitis   . Allergy     hay fever  . GERD (gastroesophageal reflux disease)   . BPH (benign prostatic hyperplasia)   . Hiatal hernia     NO SURGERY FOR THIS  . Arthritis     Medications:  Prescriptions prior to admission  Medication Sig Dispense Refill  . aspirin 81 MG tablet Take 81 mg by mouth daily.        . rosuvastatin (CRESTOR) 20 MG tablet Take 1 tablet (20 mg total) by mouth at bedtime. Appointment needed for further refills.  30 tablet  11  . HYDROcodone-acetaminophen (NORCO) 5-325 MG per tablet Take 1  tablet by mouth every 8 (eight) hours as needed. For pain      . omeprazole (PRILOSEC) 20 MG capsule Take 20 mg by mouth daily.        Admit Complaint: end stage left knee OA-failed conservative treatment  PROTOCOL: COUMADIN -DVT PX  Pharmacist System-Based Medication Review: Anticoagulation-coumadin Day#3 for DVT prophylaxis s/p L TKA (Warfarin points score  = 5). INR goal 2-3.  To d/c home with home health today.  Infectious Disease-h/o diverticulitis , pancreatitis.  Afebrile, WBC wnl. Post op Ancef x 3 doses.  Cardiovascular-HLD - ASA 81 mg, rosuvastatin 20 mg qhs resumed. VSS Gastrointestinal / Nutrition-h/o diverticulitis, GERD: PO pantoprazole; bowel regimen Neurology-no issues noted, Norco/IV morphine for pain, pain controlled Pulmonary-O2 2L [facility]-Hgb decreased, plt stable, f/u hgb Best Practices-coumadin VTE ppx (INR subtherapeutic today), enoxaparin 30 mg q12h VTE ppx, PO PPI. Home medications resumed/addressed.      Concha Norway 12/30/2010,9:48 AM

## 2010-12-31 DIAGNOSIS — IMO0001 Reserved for inherently not codable concepts without codable children: Secondary | ICD-10-CM | POA: Diagnosis not present

## 2010-12-31 DIAGNOSIS — Z471 Aftercare following joint replacement surgery: Secondary | ICD-10-CM | POA: Diagnosis not present

## 2010-12-31 DIAGNOSIS — Z7901 Long term (current) use of anticoagulants: Secondary | ICD-10-CM | POA: Diagnosis not present

## 2010-12-31 DIAGNOSIS — K219 Gastro-esophageal reflux disease without esophagitis: Secondary | ICD-10-CM | POA: Diagnosis not present

## 2010-12-31 DIAGNOSIS — Z96659 Presence of unspecified artificial knee joint: Secondary | ICD-10-CM | POA: Diagnosis not present

## 2011-01-02 DIAGNOSIS — Z7901 Long term (current) use of anticoagulants: Secondary | ICD-10-CM | POA: Diagnosis not present

## 2011-01-02 DIAGNOSIS — Z471 Aftercare following joint replacement surgery: Secondary | ICD-10-CM | POA: Diagnosis not present

## 2011-01-02 DIAGNOSIS — IMO0001 Reserved for inherently not codable concepts without codable children: Secondary | ICD-10-CM | POA: Diagnosis not present

## 2011-01-02 DIAGNOSIS — K219 Gastro-esophageal reflux disease without esophagitis: Secondary | ICD-10-CM | POA: Diagnosis not present

## 2011-01-02 DIAGNOSIS — Z96659 Presence of unspecified artificial knee joint: Secondary | ICD-10-CM | POA: Diagnosis not present

## 2011-01-03 ENCOUNTER — Encounter (HOSPITAL_COMMUNITY): Payer: Self-pay | Admitting: Orthopedic Surgery

## 2011-01-03 DIAGNOSIS — IMO0001 Reserved for inherently not codable concepts without codable children: Secondary | ICD-10-CM | POA: Diagnosis not present

## 2011-01-03 DIAGNOSIS — Z7901 Long term (current) use of anticoagulants: Secondary | ICD-10-CM | POA: Diagnosis not present

## 2011-01-03 DIAGNOSIS — K219 Gastro-esophageal reflux disease without esophagitis: Secondary | ICD-10-CM | POA: Diagnosis not present

## 2011-01-03 DIAGNOSIS — Z96659 Presence of unspecified artificial knee joint: Secondary | ICD-10-CM | POA: Diagnosis not present

## 2011-01-03 DIAGNOSIS — Z471 Aftercare following joint replacement surgery: Secondary | ICD-10-CM | POA: Diagnosis not present

## 2011-01-04 DIAGNOSIS — Z471 Aftercare following joint replacement surgery: Secondary | ICD-10-CM | POA: Diagnosis not present

## 2011-01-04 DIAGNOSIS — K219 Gastro-esophageal reflux disease without esophagitis: Secondary | ICD-10-CM | POA: Diagnosis not present

## 2011-01-04 DIAGNOSIS — Z7901 Long term (current) use of anticoagulants: Secondary | ICD-10-CM | POA: Diagnosis not present

## 2011-01-04 DIAGNOSIS — IMO0001 Reserved for inherently not codable concepts without codable children: Secondary | ICD-10-CM | POA: Diagnosis not present

## 2011-01-04 DIAGNOSIS — Z96659 Presence of unspecified artificial knee joint: Secondary | ICD-10-CM | POA: Diagnosis not present

## 2011-01-04 NOTE — Discharge Summary (Signed)
PATIENT ID:      Thomas Pearson  MRN:     [id number] DOB/AGE:    November 17, 1940 / 70 y.o.     DISCHARGE SUMMARY  ADMISSION DATE:    06-Jan-2011 DISCHARGE DATE:   12/30/2010  ADMISSION DIAGNOSIS: osteoarthritis left knee  (osteoarthritis left knee)  DISCHARGE DIAGNOSIS:  osteoarthritis left knee    ADDITIONAL DIAGNOSIS: Principal Problem:  *DEGENERATIVE JOINT DISEASE  Past Medical History  Diagnosis Date  . Hyperlipidemia   . Pancreatitis   . Diverticulitis   . Allergy     hay fever  . GERD (gastroesophageal reflux disease)   . BPH (benign prostatic hyperplasia)   . Hiatal hernia     NO SURGERY FOR THIS  . Arthritis     PROCEDURE: Procedure(s): TOTAL KNEE ARTHROPLASTY on 2011/01/06  CONSULTS:     HISTORY:  See H&P in chart  HOSPITAL COURSE:  Thomas Pearson is a 70 y.o. admitted on January 06, 2011 and found to have a diagnosis of osteoarthritis left knee.  After appropriate laboratory studies were obtained  they were taken to the operating room on 01/06/11 and underwent Procedure(s): TOTAL KNEE ARTHROPLASTY.   They were given perioperative antibiotics:  Anti-infectives     Start     Dose/Rate Route Frequency Ordered Stop   01-06-11 1600   ceFAZolin (ANCEF) IVPB 1 g/50 mL premix        1 g 100 mL/hr over 30 Minutes Intravenous Every 6 hours 2011-01-06 1529 2011/01/07 0434   12/27/10 1515   ceFAZolin (ANCEF) IVPB 2 g/50 mL premix        2 g 100 mL/hr over 30 Minutes Intravenous 60 min pre-op 12/27/10 1514 2011-01-06 0748        . Blood products given:none  The remainder of the hospital course was dedicated to ambulation and strengthening.   The patient was discharged on 2 days post op in  Good condition.   DIAGNOSTIC STUDIES: Recent vital signs: No data found.      Recent laboratory studies:  Basename 12/30/10 0700 2011/01/07 0530  WBC 9.4 8.4  HGB 10.5* 11.6*  HCT 31.4* 35.1*  PLT 158 171    Basename 2011-01-07 0530  NA 135  K 3.6  CL 101  CO2 27  BUN 15    CREATININE 0.86  GLUCOSE 194*  CALCIUM 8.3*   Lab Results  Component Value Date   INR 1.45 12/30/2010   INR 1.13 07-Jan-2011   INR 0.98 12/26/2010     Recent Radiographic Studies :  X-ray Knee Left Port  06-Jan-2011  *RADIOLOGY REPORT*  Clinical Data: Postop arthroplasty.  PORTABLE LEFT KNEE - 1-2 VIEW  Comparison: None.  Findings: Left total knee replacement.  Surgical drain good position.  Satisfactory position and alignment.  IMPRESSION: As above.  Original Report Authenticated By: Thomas Pearson, M.D.    DISCHARGE INSTRUCTIONS: Discharge Orders    Future Appointments: Provider: Department: Dept Phone: Center:   05/07/2011 2:00 PM Michele Mcalpine, MD [facility] [phone] None      DISCHARGE MEDICATIONS:  Discharge Medication List as of 12/30/2010  1:09 PM    START taking these medications   Details  docusate sodium 100 MG CAPS Take 100 mg by mouth 2 (two) times daily., Starting 12/30/2010, Until Wed 01/09/11, Print    enoxaparin (LOVENOX) 30 MG/0.3ML SOLN Inject 0.3 mLs (30 mg total) into the skin every 12 (twelve) hours., Starting 12/30/2010, Until Discontinued, Print    methocarbamol (ROBAXIN) 500 MG tablet Take  1 tablet (500 mg total) by mouth every 6 (six) hours as needed., Starting 12/30/2010, Until Wed 01/09/11, Print    warfarin (COUMADIN) 5 MG tablet Take 1 tablet (5 mg total) by mouth daily at 6 PM., Starting 12/30/2010, Until Mon 12/30/11, Print      CONTINUE these medications which have NOT CHANGED   Details  aspirin 81 MG tablet Take 81 mg by mouth daily.  , Until Discontinued, Historical Med    HYDROcodone-acetaminophen (NORCO) 5-325 MG per tablet Take 1 tablet by mouth every 8 (eight) hours as needed. For pain, Starting 09/13/2010, Until Discontinued, Historical Med    omeprazole (PRILOSEC) 20 MG capsule Take 20 mg by mouth daily. , Starting 08/27/2010, Until Discontinued, Historical Med    rosuvastatin (CRESTOR) 20 MG tablet Take 1 tablet (20 mg total) by  mouth at bedtime. Appointment needed for further refills., Starting 11/01/2010, Until Fri 11/01/11, Normal        FOLLOW UP VISIT:   Follow-up Information    Follow up with CAFFREY JR,W D, MD in 2 weeks.   Contact information:   Tracey Harries, [facility] [address] [place] [address] [phone]          DISPOSITION:  Home  Home or Self Care  CONDITION:  Dorann Ou 01/04/2011, 5:59 PM

## 2011-01-07 DIAGNOSIS — Z471 Aftercare following joint replacement surgery: Secondary | ICD-10-CM | POA: Diagnosis not present

## 2011-01-07 DIAGNOSIS — Z96659 Presence of unspecified artificial knee joint: Secondary | ICD-10-CM | POA: Diagnosis not present

## 2011-01-07 DIAGNOSIS — K219 Gastro-esophageal reflux disease without esophagitis: Secondary | ICD-10-CM | POA: Diagnosis not present

## 2011-01-07 DIAGNOSIS — IMO0001 Reserved for inherently not codable concepts without codable children: Secondary | ICD-10-CM | POA: Diagnosis not present

## 2011-01-07 DIAGNOSIS — Z7901 Long term (current) use of anticoagulants: Secondary | ICD-10-CM | POA: Diagnosis not present

## 2011-01-09 DIAGNOSIS — Z7901 Long term (current) use of anticoagulants: Secondary | ICD-10-CM | POA: Diagnosis not present

## 2011-01-09 DIAGNOSIS — K219 Gastro-esophageal reflux disease without esophagitis: Secondary | ICD-10-CM | POA: Diagnosis not present

## 2011-01-09 DIAGNOSIS — Z96659 Presence of unspecified artificial knee joint: Secondary | ICD-10-CM | POA: Diagnosis not present

## 2011-01-09 DIAGNOSIS — IMO0001 Reserved for inherently not codable concepts without codable children: Secondary | ICD-10-CM | POA: Diagnosis not present

## 2011-01-09 DIAGNOSIS — Z471 Aftercare following joint replacement surgery: Secondary | ICD-10-CM | POA: Diagnosis not present

## 2011-01-10 DIAGNOSIS — K219 Gastro-esophageal reflux disease without esophagitis: Secondary | ICD-10-CM | POA: Diagnosis not present

## 2011-01-10 DIAGNOSIS — Z96659 Presence of unspecified artificial knee joint: Secondary | ICD-10-CM | POA: Diagnosis not present

## 2011-01-10 DIAGNOSIS — Z471 Aftercare following joint replacement surgery: Secondary | ICD-10-CM | POA: Diagnosis not present

## 2011-01-10 DIAGNOSIS — Z7901 Long term (current) use of anticoagulants: Secondary | ICD-10-CM | POA: Diagnosis not present

## 2011-01-10 DIAGNOSIS — M1710 Unilateral primary osteoarthritis, unspecified knee: Secondary | ICD-10-CM | POA: Diagnosis not present

## 2011-01-10 DIAGNOSIS — IMO0001 Reserved for inherently not codable concepts without codable children: Secondary | ICD-10-CM | POA: Diagnosis not present

## 2011-01-11 DIAGNOSIS — Z7901 Long term (current) use of anticoagulants: Secondary | ICD-10-CM | POA: Diagnosis not present

## 2011-01-11 DIAGNOSIS — IMO0001 Reserved for inherently not codable concepts without codable children: Secondary | ICD-10-CM | POA: Diagnosis not present

## 2011-01-11 DIAGNOSIS — Z96659 Presence of unspecified artificial knee joint: Secondary | ICD-10-CM | POA: Diagnosis not present

## 2011-01-11 DIAGNOSIS — K219 Gastro-esophageal reflux disease without esophagitis: Secondary | ICD-10-CM | POA: Diagnosis not present

## 2011-01-11 DIAGNOSIS — Z471 Aftercare following joint replacement surgery: Secondary | ICD-10-CM | POA: Diagnosis not present

## 2011-01-14 DIAGNOSIS — Z471 Aftercare following joint replacement surgery: Secondary | ICD-10-CM | POA: Diagnosis not present

## 2011-01-14 DIAGNOSIS — Z7901 Long term (current) use of anticoagulants: Secondary | ICD-10-CM | POA: Diagnosis not present

## 2011-01-14 DIAGNOSIS — Z96659 Presence of unspecified artificial knee joint: Secondary | ICD-10-CM | POA: Diagnosis not present

## 2011-01-14 DIAGNOSIS — IMO0001 Reserved for inherently not codable concepts without codable children: Secondary | ICD-10-CM | POA: Diagnosis not present

## 2011-01-14 DIAGNOSIS — K219 Gastro-esophageal reflux disease without esophagitis: Secondary | ICD-10-CM | POA: Diagnosis not present

## 2011-01-16 DIAGNOSIS — Z96659 Presence of unspecified artificial knee joint: Secondary | ICD-10-CM | POA: Diagnosis not present

## 2011-01-16 DIAGNOSIS — Z471 Aftercare following joint replacement surgery: Secondary | ICD-10-CM | POA: Diagnosis not present

## 2011-01-16 DIAGNOSIS — Z7901 Long term (current) use of anticoagulants: Secondary | ICD-10-CM | POA: Diagnosis not present

## 2011-01-16 DIAGNOSIS — K219 Gastro-esophageal reflux disease without esophagitis: Secondary | ICD-10-CM | POA: Diagnosis not present

## 2011-01-16 DIAGNOSIS — IMO0001 Reserved for inherently not codable concepts without codable children: Secondary | ICD-10-CM | POA: Diagnosis not present

## 2011-01-18 DIAGNOSIS — Z7901 Long term (current) use of anticoagulants: Secondary | ICD-10-CM | POA: Diagnosis not present

## 2011-01-18 DIAGNOSIS — IMO0001 Reserved for inherently not codable concepts without codable children: Secondary | ICD-10-CM | POA: Diagnosis not present

## 2011-01-18 DIAGNOSIS — Z471 Aftercare following joint replacement surgery: Secondary | ICD-10-CM | POA: Diagnosis not present

## 2011-01-18 DIAGNOSIS — K219 Gastro-esophageal reflux disease without esophagitis: Secondary | ICD-10-CM | POA: Diagnosis not present

## 2011-01-18 DIAGNOSIS — Z96659 Presence of unspecified artificial knee joint: Secondary | ICD-10-CM | POA: Diagnosis not present

## 2011-01-21 DIAGNOSIS — Z96659 Presence of unspecified artificial knee joint: Secondary | ICD-10-CM | POA: Diagnosis not present

## 2011-01-21 DIAGNOSIS — M1710 Unilateral primary osteoarthritis, unspecified knee: Secondary | ICD-10-CM | POA: Diagnosis not present

## 2011-01-21 DIAGNOSIS — M25569 Pain in unspecified knee: Secondary | ICD-10-CM | POA: Diagnosis not present

## 2011-01-22 ENCOUNTER — Telehealth: Payer: Self-pay | Admitting: Pulmonary Disease

## 2011-01-22 DIAGNOSIS — Z299 Encounter for prophylactic measures, unspecified: Secondary | ICD-10-CM

## 2011-01-22 DIAGNOSIS — Z96659 Presence of unspecified artificial knee joint: Secondary | ICD-10-CM | POA: Insufficient documentation

## 2011-01-22 NOTE — Telephone Encounter (Signed)
Called and spoke with pt and he is aware of appt on Friday with SN at 9.  Pt is aware to bring all of his meds with him to this appt and he will come in early to check his protime.  Pt voiced his understanding of this.

## 2011-01-22 NOTE — Telephone Encounter (Signed)
Called and spoke with pt. He states that he had TKR done last month and the surgeon (? Name) started him on coumadin therapy at that time. He states that he was told that he needs to be on coumadin until 02/01/11. Nurse from [facility] has been coming to his home to draw labs, but she informed the pt 01/18/11 he would need to have his PCP take over his therapy and order labs. Last INR was 2.0 and he is currently taking 7.5 mg coumadin daily. Please advise, thanks!

## 2011-01-23 ENCOUNTER — Encounter (HOSPITAL_COMMUNITY): Payer: Self-pay

## 2011-01-23 ENCOUNTER — Emergency Department (HOSPITAL_COMMUNITY): Payer: Medicare Other

## 2011-01-23 ENCOUNTER — Inpatient Hospital Stay (HOSPITAL_COMMUNITY)
Admission: EM | Admit: 2011-01-23 | Discharge: 2011-01-26 | DRG: 392 | Disposition: A | Discharge status: Home / Self Care | Payer: Medicare Other | Attending: Internal Medicine | Admitting: Internal Medicine

## 2011-01-23 DIAGNOSIS — K5792 Diverticulitis of intestine, part unspecified, without perforation or abscess without bleeding: Secondary | ICD-10-CM

## 2011-01-23 DIAGNOSIS — E785 Hyperlipidemia, unspecified: Secondary | ICD-10-CM | POA: Diagnosis present

## 2011-01-23 DIAGNOSIS — N400 Enlarged prostate without lower urinary tract symptoms: Secondary | ICD-10-CM | POA: Diagnosis present

## 2011-01-23 DIAGNOSIS — K5732 Diverticulitis of large intestine without perforation or abscess without bleeding: Secondary | ICD-10-CM | POA: Diagnosis not present

## 2011-01-23 DIAGNOSIS — K219 Gastro-esophageal reflux disease without esophagitis: Secondary | ICD-10-CM | POA: Diagnosis present

## 2011-01-23 DIAGNOSIS — Z96659 Presence of unspecified artificial knee joint: Secondary | ICD-10-CM

## 2011-01-23 DIAGNOSIS — M25569 Pain in unspecified knee: Secondary | ICD-10-CM | POA: Diagnosis not present

## 2011-01-23 DIAGNOSIS — R197 Diarrhea, unspecified: Secondary | ICD-10-CM | POA: Diagnosis present

## 2011-01-23 DIAGNOSIS — N281 Cyst of kidney, acquired: Secondary | ICD-10-CM | POA: Diagnosis not present

## 2011-01-23 DIAGNOSIS — K859 Acute pancreatitis without necrosis or infection, unspecified: Secondary | ICD-10-CM | POA: Diagnosis not present

## 2011-01-23 DIAGNOSIS — M1710 Unilateral primary osteoarthritis, unspecified knee: Secondary | ICD-10-CM | POA: Diagnosis not present

## 2011-01-23 MED ORDER — FENTANYL CITRATE 0.05 MG/ML IJ SOLN
12.5000 ug | Freq: Once | INTRAMUSCULAR | Status: AC
  Administered 2011-01-24: 12.5 ug via INTRAVENOUS
  Filled 2011-01-23: qty 2

## 2011-01-23 MED ORDER — SODIUM CHLORIDE 0.9 % IV BOLUS (SEPSIS)
1000.0000 mL | Freq: Once | INTRAVENOUS | Status: AC
  Administered 2011-01-23: 1000 mL via INTRAVENOUS

## 2011-01-23 MED ORDER — ONDANSETRON HCL 4 MG/2ML IJ SOLN
4.0000 mg | Freq: Once | INTRAMUSCULAR | Status: AC
  Administered 2011-01-24: 4 mg via INTRAVENOUS
  Filled 2011-01-23: qty 2

## 2011-01-23 NOTE — ED Notes (Signed)
Pt had knee replacement one month ago, Monday night he began to have severe abd pain with vomiting and diarrhea

## 2011-01-24 ENCOUNTER — Encounter (HOSPITAL_COMMUNITY): Payer: Self-pay | Admitting: Family Medicine

## 2011-01-24 DIAGNOSIS — R1031 Right lower quadrant pain: Secondary | ICD-10-CM | POA: Diagnosis not present

## 2011-01-24 DIAGNOSIS — E785 Hyperlipidemia, unspecified: Secondary | ICD-10-CM | POA: Diagnosis present

## 2011-01-24 DIAGNOSIS — R197 Diarrhea, unspecified: Secondary | ICD-10-CM | POA: Diagnosis present

## 2011-01-24 DIAGNOSIS — Z96659 Presence of unspecified artificial knee joint: Secondary | ICD-10-CM | POA: Diagnosis not present

## 2011-01-24 DIAGNOSIS — E782 Mixed hyperlipidemia: Secondary | ICD-10-CM | POA: Diagnosis not present

## 2011-01-24 DIAGNOSIS — N400 Enlarged prostate without lower urinary tract symptoms: Secondary | ICD-10-CM | POA: Diagnosis present

## 2011-01-24 DIAGNOSIS — K219 Gastro-esophageal reflux disease without esophagitis: Secondary | ICD-10-CM | POA: Diagnosis present

## 2011-01-24 DIAGNOSIS — K5732 Diverticulitis of large intestine without perforation or abscess without bleeding: Secondary | ICD-10-CM | POA: Diagnosis not present

## 2011-01-24 LAB — COMPREHENSIVE METABOLIC PANEL
AST: 21 U/L (ref 0–37)
Albumin: 3.6 g/dL (ref 3.5–5.2)
Alkaline Phosphatase: 138 U/L — ABNORMAL HIGH (ref 39–117)
Chloride: 105 mEq/L (ref 96–112)
Potassium: 3.7 mEq/L (ref 3.5–5.1)
Sodium: 140 mEq/L (ref 135–145)
Total Bilirubin: 0.2 mg/dL — ABNORMAL LOW (ref 0.3–1.2)
Total Protein: 7.5 g/dL (ref 6.0–8.3)

## 2011-01-24 LAB — PROTIME-INR
INR: 2.29 — ABNORMAL HIGH (ref 0.00–1.49)
Prothrombin Time: 25.6 seconds — ABNORMAL HIGH (ref 11.6–15.2)

## 2011-01-24 LAB — URINE MICROSCOPIC-ADD ON

## 2011-01-24 LAB — URINALYSIS, ROUTINE W REFLEX MICROSCOPIC
Glucose, UA: NEGATIVE mg/dL
Protein, ur: 30 mg/dL — AB
pH: 5 (ref 5.0–8.0)

## 2011-01-24 LAB — CBC
Platelets: 294 10*3/uL (ref 150–400)
RDW: 13 % (ref 11.5–15.5)
WBC: 7.8 10*3/uL (ref 4.0–10.5)

## 2011-01-24 LAB — POCT I-STAT, CHEM 8
Calcium, Ion: 1.13 mmol/L (ref 1.12–1.32)
Chloride: 107 mEq/L (ref 96–112)
Glucose, Bld: 124 mg/dL — ABNORMAL HIGH (ref 70–99)
HCT: 38 % — ABNORMAL LOW (ref 39.0–52.0)
Hemoglobin: 12.9 g/dL — ABNORMAL LOW (ref 13.0–17.0)

## 2011-01-24 LAB — LIPASE, BLOOD: Lipase: 37 U/L (ref 11–59)

## 2011-01-24 MED ORDER — ASPIRIN 81 MG PO CHEW
81.0000 mg | CHEWABLE_TABLET | Freq: Every day | ORAL | Status: DC
  Administered 2011-01-24 – 2011-01-26 (×3): 81 mg via ORAL
  Filled 2011-01-24 (×3): qty 1

## 2011-01-24 MED ORDER — MORPHINE SULFATE 2 MG/ML IJ SOLN: 2.0000 mg | INTRAMUSCULAR | Status: DC | PRN

## 2011-01-24 MED ORDER — METRONIDAZOLE IN NACL 5-0.79 MG/ML-% IV SOLN
500.0000 mg | Freq: Once | INTRAVENOUS | Status: AC
  Administered 2011-01-24: 500 mg via INTRAVENOUS
  Filled 2011-01-24: qty 100

## 2011-01-24 MED ORDER — ONDANSETRON HCL 4 MG PO TABS: 4.0000 mg | ORAL_TABLET | Freq: Four times a day (QID) | ORAL | Status: DC | PRN

## 2011-01-24 MED ORDER — ACETAMINOPHEN 325 MG PO TABS: 650.0000 mg | ORAL_TABLET | Freq: Four times a day (QID) | ORAL | Status: DC | PRN

## 2011-01-24 MED ORDER — ACETAMINOPHEN 650 MG RE SUPP: 650.0000 mg | Freq: Four times a day (QID) | RECTAL | Status: DC | PRN

## 2011-01-24 MED ORDER — IOHEXOL 300 MG/ML  SOLN
100.0000 mL | Freq: Once | INTRAMUSCULAR | Status: AC | PRN
  Administered 2011-01-24: 100 mL via INTRAVENOUS

## 2011-01-24 MED ORDER — PANTOPRAZOLE SODIUM 40 MG PO TBEC
40.0000 mg | DELAYED_RELEASE_TABLET | Freq: Every day | ORAL | Status: DC
  Administered 2011-01-24 – 2011-01-26 (×3): 40 mg via ORAL
  Filled 2011-01-24 (×3): qty 1

## 2011-01-24 MED ORDER — ROSUVASTATIN CALCIUM 20 MG PO TABS
20.0000 mg | ORAL_TABLET | Freq: Every day | ORAL | Status: DC
  Administered 2011-01-24 – 2011-01-25 (×2): 20 mg via ORAL
  Filled 2011-01-24 (×4): qty 1

## 2011-01-24 MED ORDER — HYDROCODONE-ACETAMINOPHEN 5-325 MG PO TABS
1.0000 | ORAL_TABLET | ORAL | Status: DC | PRN
  Administered 2011-01-24 – 2011-01-25 (×4): 1 via ORAL
  Filled 2011-01-24 (×4): qty 1

## 2011-01-24 MED ORDER — ONDANSETRON HCL 4 MG/2ML IJ SOLN: 4.0000 mg | Freq: Four times a day (QID) | INTRAMUSCULAR | Status: DC | PRN

## 2011-01-24 MED ORDER — POTASSIUM CHLORIDE IN NACL 20-0.9 MEQ/L-% IV SOLN
INTRAVENOUS | Status: DC
  Administered 2011-01-24 – 2011-01-25 (×3): via INTRAVENOUS
  Filled 2011-01-24 (×9): qty 1000

## 2011-01-24 MED ORDER — CIPROFLOXACIN IN D5W 400 MG/200ML IV SOLN
400.0000 mg | Freq: Once | INTRAVENOUS | Status: AC
  Administered 2011-01-24: 400 mg via INTRAVENOUS
  Filled 2011-01-24: qty 200

## 2011-01-24 MED ORDER — METRONIDAZOLE 500 MG PO TABS: 500.0000 mg | ORAL_TABLET | Freq: Once | ORAL | Status: DC

## 2011-01-24 MED ORDER — ALUM & MAG HYDROXIDE-SIMETH 200-200-20 MG/5ML PO SUSP: 30.0000 mL | Freq: Four times a day (QID) | ORAL | Status: DC | PRN

## 2011-01-24 MED ORDER — METRONIDAZOLE IN NACL 5-0.79 MG/ML-% IV SOLN
500.0000 mg | Freq: Three times a day (TID) | INTRAVENOUS | Status: DC
  Administered 2011-01-24 – 2011-01-26 (×6): 500 mg via INTRAVENOUS
  Filled 2011-01-24 (×9): qty 100

## 2011-01-24 MED ORDER — CIPROFLOXACIN IN D5W 200 MG/100ML IV SOLN
200.0000 mg | Freq: Two times a day (BID) | INTRAVENOUS | Status: DC
  Administered 2011-01-24 – 2011-01-26 (×4): 200 mg via INTRAVENOUS
  Filled 2011-01-24 (×7): qty 100

## 2011-01-24 MED ORDER — CIPROFLOXACIN HCL 500 MG PO TABS: 500.0000 mg | ORAL_TABLET | Freq: Once | ORAL | Status: DC

## 2011-01-24 NOTE — H&P (Addendum)
PCP:   Michele Mcalpine, MD, MD  GI; [facility]  Chief Complaint:  Abdominal pain  HPI: This is a 71 year old male with known history of diverticulitis, he states he developed abdominal pain in the bilateral lower quadrants yesterday. He describes the pain as crampy. He does report some nausea vomiting and diarrhea, no evidence of bleeding. He reports no fevers but he did have some chills. He finally decided to come to the ER. The patient's most recent colonoscopy was in September 2012. In December 2012 he had the left total knee replacements and at that time he was given IV antibiotics up her operatively. He states he has no other recent use of antibiotics. History provided by patient, his son and daughter at the bedside. Currently patient is pain-free, fetus received fentanyl.  Review of Systems: Positives bolded anorexia, fever, weight loss,, vision loss, decreased hearing, hoarseness, chest pain, syncope, dyspnea on exertion, peripheral edema, balance deficits, hemoptysis, abdominal pain, melena, hematochezia, severe indigestion/heartburn, hematuria, incontinence, genital sores, muscle weakness, suspicious skin lesions, transient blindness, difficulty walking, depression, unusual weight change, abnormal bleeding, enlarged lymph nodes, angioedema, and breast masses.  Past Medical History: Past Medical History  Diagnosis Date  . Hyperlipidemia   . Pancreatitis   . Diverticulitis   . Allergy     hay fever  . GERD (gastroesophageal reflux disease)   . BPH (benign prostatic hyperplasia)   . Hiatal hernia     NO SURGERY FOR THIS  . Arthritis    Past Surgical History  Procedure Date  . Prostate surgery   . Total knee arthroplasty 12/28/2010    Procedure: TOTAL KNEE ARTHROPLASTY;  Surgeon: Thera Flake., MD;  Location: [facility] OR;  Service: Orthopedics;  Laterality: Left;  left total knee - [id number]    Medications: Prior to Admission medications   Medication Sig Start Date End Date Taking?  Authorizing Provider  aspirin 81 MG tablet Take 81 mg by mouth daily.     Yes Historical Provider, MD  HYDROcodone-acetaminophen (NORCO) 5-325 MG per tablet Take 1 tablet by mouth every 8 (eight) hours as needed. For pain 09/13/10  Yes Historical Provider, MD  omeprazole (PRILOSEC) 20 MG capsule Take 20 mg by mouth daily.  08/27/10  Yes Historical Provider, MD  rosuvastatin (CRESTOR) 20 MG tablet Take 1 tablet (20 mg total) by mouth at bedtime. Appointment needed for further refills. 11/01/10 11/01/11 Yes Michele Mcalpine, MD  warfarin (COUMADIN) 7.5 MG tablet Take 7.5 mg by mouth daily.   Yes Historical Provider, MD    Allergies:   Allergies  Allergen Reactions  . Oxycodone-Acetaminophen     REACTION: hives    Social History:  reports that he quit smoking about 46 years ago. His smoking use included Cigarettes. He quit smokeless tobacco use about 45 years ago. He reports that he does not drink alcohol or use illicit drugs.  Family History: Family History  Problem Relation Age of Onset  . Cancer Brother     prostate  . Colon cancer Neg Hx   . Heart disease Father     Physical Exam: Filed Vitals:   01/23/11 2321 01/24/11 0226  BP: 153/83 153/79  Pulse: 75 76  Temp: 99 F (37.2 C) 98.3 F (36.8 C)  TempSrc: Oral Oral  Resp: 16 18  SpO2: 99% 100%    General:  Alert and oriented times three, well developed and nourished, no acute distress Eyes: PERRLA, pink conjunctiva, no scleral icterus ENT: Moist oral mucosa, neck  supple, no thyromegaly Lungs: clear to ascultation, no wheeze, no crackles, no use of accessory muscles Cardiovascular: regular rate and rhythm, no regurgitation, no gallops, no murmurs. No carotid bruits, no JVD Abdomen: soft, positive BS, non-tender, non-distended, no organomegaly, not an acute abdomen GU: not examined Neuro: CN II - XII grossly intact, sensation intact Musculoskeletal: strength 5/5 all extremities, no clubbing, cyanosis or edema, status post  left knee replacement, surgical scar well-healed  Skin: no rash, no subcutaneous crepitation, no decubitus Psych: appropriate patient   Labs on Admission:   [facility] 01/24/11 0113 01/24/11 0057  NA 142 140  K 3.7 3.7  CL 107 105  CO2 -- 24  GLUCOSE 124* 124*  BUN 16 15  CREATININE 1.00 0.98  CALCIUM -- 9.3  MG -- --  PHOS -- --    Basename 01/24/11 0057  AST 21  ALT 16  ALKPHOS 138*  BILITOT 0.2*  PROT 7.5  ALBUMIN 3.6  Results for Thomas Pearson, Thomas Pearson (MRN [id number]) as of 01/24/2011 05:18  Ref. Range 01/24/2011 00:57  Prothrombin Time Latest Range: 11.6-15.2 seconds 25.6 (H)  INR Latest Range: 0.00-1.49  2.29 (H)    Basename 01/24/11 0057  LIPASE 37  AMYLASE --    Basename 01/24/11 0113 01/24/11 0057  WBC -- 7.8  NEUTROABS -- --  HGB 12.9* 11.3*  HCT 38.0* 35.4*  MCV -- 86.8  PLT -- 294   No results found for this basename: CKTOTAL:3,CKMB:3,CKMBINDEX:3,TROPONINI:3 in the last 72 hours No components found with this basename: POCBNP:3 No results found for this basename: DDIMER:2 in the last 72 hours No results found for this basename: HGBA1C:2 in the last 72 hours No results found for this basename: CHOL:2,HDL:2,LDLCALC:2,TRIG:2,CHOLHDL:2,LDLDIRECT:2 in the last 72 hours No results found for this basename: TSH,T4TOTAL,FREET3,T3FREE,THYROIDAB in the last 72 hours No results found for this basename: VITAMINB12:2,FOLATE:2,FERRITIN:2,TIBC:2,IRON:2,RETICCTPCT:2 in the last 72 hours  Micro Results: No results found for this or any previous visit (from the past 240 hour(s)). Results for Thomas Pearson, Thomas Pearson (MRN [id number]) as of 01/24/2011 05:18  Ref. Range 01/24/2011 00:14  Color, Urine Latest Range: YELLOW  YELLOW  APPearance Latest Range: CLEAR  CLOUDY (A)  Specific Gravity, Urine Latest Range: 1.005-1.030  1.028  pH Latest Range: 5.0-8.0  5.0  Glucose, UA Latest Range: NEGATIVE mg/dL NEGATIVE  Bilirubin Urine Latest Range: NEGATIVE  NEGATIVE  Ketones, ur Latest  Range: NEGATIVE mg/dL TRACE (A)  Protein Latest Range: NEGATIVE mg/dL 30 (A)  Urobilinogen, UA Latest Range: 0.0-1.0 mg/dL 0.2  Nitrite Latest Range: NEGATIVE  NEGATIVE  Leukocytes, UA Latest Range: NEGATIVE  NEGATIVE  Urine-Other No range found MUCOUS PRESENT  Bacteria, UA Latest Range: RARE  RARE  Crystals Latest Range: NEGATIVE  CA OXALATE CRYSTALS (A)  Casts Latest Range: NEGATIVE  HYALINE CASTS (A)    Radiological Exams on Admission: Ct Abdomen Pelvis W Contrast  01/24/2011  *RADIOLOGY REPORT*  Clinical Data: Severe abdominal pain for 3-4 days; nausea and vomiting.  History of pancreatitis, diverticulitis and hiatal hernia.  CT ABDOMEN AND PELVIS WITH CONTRAST  Technique:  Multidetector CT imaging of the abdomen and pelvis was performed following the standard protocol during bolus administration of intravenous contrast.  Contrast: OMNIPAQUE IOHEXOL 300 MG/ML IV SOLN  Comparison: CT of the abdomen and pelvis performed 09/13/2010  Findings: Minimal bibasilar atelectasis is noted.  The liver and spleen are unremarkable in appearance.  The gallbladder is relatively decompressed and is within normal limits. The pancreas and adrenal glands are unremarkable. There is  no evidence for pancreatitis.  There is a 2.1 cm cyst noted near the left upper pole of the left kidney.  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  Scattered smaller left renal cysts are seen.  The right kidney is unremarkable in appearance. No hydronephrosis is appreciated.  No renal or ureteral stones are identified.  No perinephric soft tissue stranding is appreciated.  No free fluid is identified.  The small bowel is unremarkable in appearance.  The stomach is within normal limits.  No acute vascular abnormalities are seen.  Minimal calcification is noted along the abdominal aorta.  The appendix is normal in caliber, without evidence for appendicitis.  Focal colonic wall thickening  is noted along the proximal transverse colon.  This is new from the prior study; malignancy cannot be entirely excluded.  There is also more diffuse segmental wall thickening along the proximal sigmoid colon, with scattered associated diverticula. Though no significant associated soft tissue inflammation is seen, this could reflect mild diverticulitis.  The bladder is mildly distended and grossly unremarkable in appearance.  The prostate is enlarged, measuring 5.7 cm in transverse dimension.  No inguinal lymphadenopathy is seen.  No acute osseous abnormalities are identified.  Disc space narrowing and vacuum phenomenon are noted at L5-S1.  IMPRESSION:  1.  Segmental wall thickening along the proximal sigmoid colon, with scattered associated diverticula.  Though no significant associated soft tissue inflammation is seen, findings raise concern for mild diverticulitis. 2.  Focal colonic wall thickening noted along the proximal sigmoid colon.  This is new from the prior study; malignancy cannot be excluded given its appearance.  Following treatment of the acute infectious process, colonoscopy could be considered for further evaluation, as deemed clinically appropriate. 3.  Enlarged prostate. 4.  No evidence of pancreatitis. 5.  Left renal cyst noted.  Original Report Authenticated By: Tonia Ghent, M.D.    Assessment/Plan Present on Admission:  .Diverticulitis Admit to MedSurg Cipro and Flagyl ordered Pain medication as needed N.p.o., IV fluid hydration  CT abdomen and pelvis raises question of malignancy.this is doubtful given his recent colonoscopy. .Diarrhea C. difficile toxins  .BENIGN PROSTATIC HYPERTROPHY, HX OF .HYPERCHOLESTEROLEMIA Stable, resume home medications Status post recent left knee replacement Patient on Coumadin for DVT prophylaxis. He states his last dose would've been tomorrow, I will DC Coumadin. Lovenox is being sent for DVT prophylaxis as patient's INR is therapeutic.   Full  code SCDs for DVT prophylaxis Team 3   Iesha Summerhill 01/24/2011, 5:16 AM

## 2011-01-24 NOTE — ED Notes (Signed)
Report called to [place] on [address] [place].

## 2011-01-24 NOTE — ED Notes (Signed)
Attempted to call report to [address] [place].  Nurse unavailable.

## 2011-01-24 NOTE — ED Notes (Signed)
Report received from [employer]. Pt resting in bed quietly. Pt with c/o being cold pt given blankets. Pt oob ambulating to restroom without difficulty. Pt is alert and oriented at this time. Pt is in nad pt is awaiting admit

## 2011-01-24 NOTE — Progress Notes (Signed)
I have seen and examined the patient, he states that his nausea vomiting and abdominal pain are improved, but still with diarrhea this morning. We'll continue our current management plan as per Dr. Joneen Roach, follow and further recommendations pending clinical course.

## 2011-01-25 ENCOUNTER — Ambulatory Visit: Payer: Medicare Other | Admitting: Pulmonary Disease

## 2011-01-25 DIAGNOSIS — E782 Mixed hyperlipidemia: Secondary | ICD-10-CM | POA: Diagnosis not present

## 2011-01-25 DIAGNOSIS — R197 Diarrhea, unspecified: Secondary | ICD-10-CM | POA: Diagnosis not present

## 2011-01-25 DIAGNOSIS — K5732 Diverticulitis of large intestine without perforation or abscess without bleeding: Secondary | ICD-10-CM | POA: Diagnosis not present

## 2011-01-25 LAB — BASIC METABOLIC PANEL
CO2: 21 mEq/L (ref 19–32)
Calcium: 8.7 mg/dL (ref 8.4–10.5)
Creatinine, Ser: 0.9 mg/dL (ref 0.50–1.35)
Glucose, Bld: 71 mg/dL (ref 70–99)
Sodium: 138 mEq/L (ref 135–145)

## 2011-01-25 LAB — CBC
MCH: 28.4 pg (ref 26.0–34.0)
MCV: 86.3 fL (ref 78.0–100.0)
Platelets: 233 10*3/uL (ref 150–400)
RBC: 3.35 MIL/uL — ABNORMAL LOW (ref 4.22–5.81)

## 2011-01-25 MED ORDER — POTASSIUM CHLORIDE CRYS ER 20 MEQ PO TBCR
20.0000 meq | EXTENDED_RELEASE_TABLET | Freq: Once | ORAL | Status: AC
  Administered 2011-01-25: 20 meq via ORAL
  Filled 2011-01-25: qty 1

## 2011-01-25 NOTE — Progress Notes (Signed)
Patient ID: Thomas Pearson, male   DOB: 1940/04/25, 71 y.o.   MRN: [id number] Subjective: No events overnight. Patient denies chest pain, shortness of breath, abdominal pain. No reports of diarrhea. Patient expressed he wanted to try regular diet.  Objective:  Vital signs in last 24 hours:  Filed Vitals:   01/24/11 1705 01/24/11 2121 01/25/11 0509 01/25/11 1305  BP:  136/67 125/65 112/67  Pulse:  58 77 59  Temp:  98.5 F (36.9 C) 98 F (36.7 C) 98.1 F (36.7 C)  TempSrc:  Oral Oral Oral  Resp:  20 20 18   Height: 6' (1.829 m)     Weight: 74.6 kg (164 lb 7.4 oz)     SpO2:  98% 100% 98%    Intake/Output from previous day:   Intake/Output Summary (Last 24 hours) at 01/25/11 2008 Last data filed at 01/25/11 1305  Gross per 24 hour  Intake   1089 ml  Output      0 ml  Net   1089 ml    Physical Exam: General: Alert, awake, oriented x3, in no acute distress. HEENT: No bruits, no goiter. Moist mucous membranes, no scleral icterus, no conjunctival pallor. Heart: Regular rate and rhythm, S1/S2 +, no murmurs, rubs, gallops. Lungs: Clear to auscultation bilaterally. No wheezing, no rhonchi, no rales.  Abdomen: Soft, nontender, nondistended, positive bowel sounds. Extremities: No clubbing or cyanosis, no pitting edema,  positive pedal pulses. Neuro: Grossly nonfocal.  Lab Results:  Basic Metabolic Panel:    Component Value Date/Time   NA 138 01/25/2011 0345   K 3.4* 01/25/2011 0345   CL 104 01/25/2011 0345   CO2 21 01/25/2011 0345   BUN 8 01/25/2011 0345   CREATININE 0.90 01/25/2011 0345   GLUCOSE 71 01/25/2011 0345   CALCIUM 8.7 01/25/2011 0345   CBC:    Component Value Date/Time   WBC 4.7 01/25/2011 0345   HGB 9.5* 01/25/2011 0345   HCT 28.9* 01/25/2011 0345   PLT 233 01/25/2011 0345   MCV 86.3 01/25/2011 0345   NEUTROABS 3.9 12/26/2010 1008   LYMPHSABS 2.3 12/26/2010 1008   MONOABS 0.6 12/26/2010 1008   EOSABS 0.1 12/26/2010 1008   BASOSABS 0.0 12/26/2010 1008       Lab 01/25/11 0345 01/24/11 0113 01/24/11 0057  WBC 4.7 -- 7.8  HGB 9.5* 12.9* 11.3*  HCT 28.9* 38.0* 35.4*  PLT 233 -- 294  MCV 86.3 -- 86.8  MCH 28.4 -- 27.7  MCHC 32.9 -- 31.9  RDW 12.9 -- 13.0  LYMPHSABS -- -- --  MONOABS -- -- --  EOSABS -- -- --  BASOSABS -- -- --  BANDABS -- -- --    Lab 01/25/11 0345 01/24/11 0113 01/24/11 0057  NA 138 142 140  K 3.4* 3.7 3.7  CL 104 107 105  CO2 21 -- 24  GLUCOSE 71 124* 124*  BUN 8 16 15   CREATININE 0.90 1.00 0.98  CALCIUM 8.7 -- 9.3  MG -- -- --    Lab 01/24/11 0057  INR 2.29*  PROTIME --   Cardiac markers: No results found for this basename: CK:3,CKMB:3,TROPONINI:3,MYOGLOBIN:3 in the last 168 hours No components found with this basename: POCBNP:3 Recent Results (from the past 240 hour(s))  CLOSTRIDIUM DIFFICILE BY PCR     Status: Normal   Collection Time   01/24/11  7:52 AM      Component Value Range Status Comment   C difficile by pcr NEGATIVE  NEGATIVE  Final  Studies/Results: Ct Abdomen Pelvis W Contrast  01/24/2011  *RADIOLOGY REPORT*  Clinical Data: Severe abdominal pain for 3-4 days; nausea and vomiting.  History of pancreatitis, diverticulitis and hiatal hernia.  CT ABDOMEN AND PELVIS WITH CONTRAST  Technique:  Multidetector CT imaging of the abdomen and pelvis was performed following the standard protocol during bolus administration of intravenous contrast.  Contrast: OMNIPAQUE IOHEXOL 300 MG/ML IV SOLN  Comparison: CT of the abdomen and pelvis performed 09/13/2010  Findings: Minimal bibasilar atelectasis is noted.  The liver and spleen are unremarkable in appearance.  The gallbladder is relatively decompressed and is within normal limits. The pancreas and adrenal glands are unremarkable. There is no evidence for pancreatitis.  There is a 2.1 cm cyst noted near the left upper pole of the left kidney.  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  Scattered  smaller left renal cysts are seen.  The right kidney is unremarkable in appearance. No hydronephrosis is appreciated.  No renal or ureteral stones are identified.  No perinephric soft tissue stranding is appreciated.  No free fluid is identified.  The small bowel is unremarkable in appearance.  The stomach is within normal limits.  No acute vascular abnormalities are seen.  Minimal calcification is noted along the abdominal aorta.  The appendix is normal in caliber, without evidence for appendicitis.  Focal colonic wall thickening is noted along the proximal transverse colon.  This is new from the prior study; malignancy cannot be entirely excluded.  There is also more diffuse segmental wall thickening along the proximal sigmoid colon, with scattered associated diverticula. Though no significant associated soft tissue inflammation is seen, this could reflect mild diverticulitis.  The bladder is mildly distended and grossly unremarkable in appearance.  The prostate is enlarged, measuring 5.7 cm in transverse dimension.  No inguinal lymphadenopathy is seen.  No acute osseous abnormalities are identified.  Disc space narrowing and vacuum phenomenon are noted at L5-S1.  IMPRESSION:  1.  Segmental wall thickening along the proximal sigmoid colon, with scattered associated diverticula.  Though no significant associated soft tissue inflammation is seen, findings raise concern for mild diverticulitis. 2.  Focal colonic wall thickening noted along the proximal sigmoid colon.  This is new from the prior study; malignancy cannot be excluded given its appearance.  Following treatment of the acute infectious process, colonoscopy could be considered for further evaluation, as deemed clinically appropriate. 3.  Enlarged prostate. 4.  No evidence of pancreatitis. 5.  Left renal cyst noted.  Original Report Authenticated By: Tonia Ghent, M.D.    Medications: Scheduled Meds:   . aspirin  81 mg Oral Daily  . ciprofloxacin   200 mg Intravenous Q12H  . metronidazole  500 mg Intravenous Q8H  . pantoprazole  40 mg Oral Q1200  . potassium chloride  20 mEq Oral Once  . rosuvastatin  20 mg Oral QHS   Continuous Infusions:   . 0.9 % NaCl with KCl 20 mEq / L 100 mL/hr at 01/25/11 1233   PRN Meds:.acetaminophen, acetaminophen, alum & mag hydroxide-simeth, HYDROcodone-acetaminophen, morphine, ondansetron (ZOFRAN) IV, ondansetron  Assessment/Plan:  Principal Problem:   *Diverticulitis - continue cipro and flagyl - patient feels much better today with no complaints of nausea or vomiting or diarrhea - anticipate discharge tomorrow am  Active Problems:   Diarrhea - as per patient resolved - continue management as above   EDUCATION - test results and diagnostic studies were discussed with patient  at the bedside -  patient has verbalized the understanding - questions were answered at the bedside and contact information was provided for additional questions or concerns   LOS: 2 days   Lleyton Byers 01/25/2011, 8:08 PM  [facility] Pager: [phone]

## 2011-01-25 NOTE — Clinical Documentation Improvement (Signed)
Anemia Blood Loss Clarification  THIS DOCUMENT IS NOT A PERMANENT PART OF THE MEDICAL RECORD  RESPOND TO THE THIS QUERY, FOLLOW THE INSTRUCTIONS BELOW:  1. If needed, update documentation for the patient's encounter via the notes activity.  2. Access this query again and click edit on the In [employer].  3. After updating, or not, click F2 to complete all highlighted (required) fields concerning your review. Select "additional documentation in the medical record" OR "no additional documentation provided".  4. Click Sign note button.  5. The deficiency will fall out of your In Basket *Please let us know if you are not able to complete this workflow by phone or e-mail (listed below).        01/25/11  Dear Arbie Cookey  Marton Redwood  In an effort to better capture your patient's severity of illness, reflect appropriate length of stay and utilization of resources, a review of the patient medical record has revealed the following indicators.    Based on your clinical judgment, please clarify and document in a progress note and/or discharge summary the clinical condition associated with the following supporting information:  In responding to this query please exercise your independent judgment.  The fact that a query is asked, does not imply that any particular answer is desired or expected. Please clarify in progress notes and d/c summary the underlying diagnosis responsible for the abnormal H&H values. Thank you. Possible Clinical Conditions?   ____Expected Acute Blood Loss Anemia ____Iron deficiency anemia  ____Acute Blood Loss Anemia ____Acute on chronic blood loss anemia  ____Chronic blood loss anemia  ____Precipitous drop in Hematocrit  ____Other Condition________________  _____Cannot Clinically Determine   Supporting Information:  Risk Factors: recent surgery Signs and Symptoms:Diarrhea, abd pain, nausea, vomiting                                       HGB                            HCT                         Latest Ref Rng               13.0 - 17.0 g/dL        14.7 - 82.9 %               11/24/2011                    11.3 (L)                      35.4(L)                                                                                                 01/24/2011                      12.9 (L)  38.0 (L)                                                                                                 01/25/2011                       9.5 (L)                       28.9 (L)                     Treatments: IV fluids / plasma expander:  NS  with 20 KCL@ 162ml/hr  Serial H&H monitoring   Reviewed:  no additional documentation provided  Thank You,  Andy Gauss RN  Clinical Documentation Specialist:  Pager [phone] E-mail [email or web address]  Health Information Management [place]

## 2011-01-26 DIAGNOSIS — E782 Mixed hyperlipidemia: Secondary | ICD-10-CM | POA: Diagnosis not present

## 2011-01-26 DIAGNOSIS — K5732 Diverticulitis of large intestine without perforation or abscess without bleeding: Secondary | ICD-10-CM | POA: Diagnosis not present

## 2011-01-26 DIAGNOSIS — R197 Diarrhea, unspecified: Secondary | ICD-10-CM | POA: Diagnosis not present

## 2011-01-26 LAB — CBC
HCT: 28.1 % — ABNORMAL LOW (ref 39.0–52.0)
MCHC: 32.7 g/dL (ref 30.0–36.0)
RDW: 12.8 % (ref 11.5–15.5)

## 2011-01-26 LAB — BASIC METABOLIC PANEL
BUN: 12 mg/dL (ref 6–23)
Calcium: 8.1 mg/dL — ABNORMAL LOW (ref 8.4–10.5)
Creatinine, Ser: 0.99 mg/dL (ref 0.50–1.35)
GFR calc Af Amer: >90 mL/min (ref >90)
GFR calc non Af Amer: 80 mL/min — ABNORMAL LOW (ref >90)
Potassium: 3.7 mEq/L (ref 3.5–5.1)

## 2011-01-26 MED ORDER — CIPROFLOXACIN HCL 500 MG PO TABS: 500.0000 mg | ORAL_TABLET | Freq: Two times a day (BID) | ORAL | Status: DC

## 2011-01-26 MED ORDER — METRONIDAZOLE 500 MG PO TABS: 500.0000 mg | ORAL_TABLET | Freq: Three times a day (TID) | ORAL | Status: DC

## 2011-01-26 MED ORDER — CIPROFLOXACIN HCL 500 MG PO TABS: 500.0000 mg | ORAL_TABLET | Freq: Two times a day (BID) | ORAL | Status: AC

## 2011-01-26 MED ORDER — METRONIDAZOLE 500 MG PO TABS: 500.0000 mg | ORAL_TABLET | Freq: Three times a day (TID) | ORAL | Status: AC

## 2011-01-26 NOTE — ED Provider Notes (Signed)
History     CSN: [id number]  Arrival date & time 01/23/11  2313   First MD Initiated Contact with Patient 01/23/11 2340      Chief Complaint  Patient presents with  . Abdominal Pain  . Nausea  . Emesis  . Diarrhea    (Consider location/radiation/quality/duration/timing/severity/associated sxs/prior treatment) Patient is a 71 y.o. male presenting with abdominal pain, vomiting, and diarrhea. The history is provided by the patient.  Abdominal Pain The primary symptoms of the illness include abdominal pain, nausea, vomiting and diarrhea. The primary symptoms of the illness do not include fever, shortness of breath or dysuria. The current episode started yesterday. The onset of the illness was gradual. The problem has been gradually worsening.  Associated with: nothing. The patient has had a change in bowel habit. Additional symptoms associated with the illness include frequency. Symptoms associated with the illness do not include chills, diaphoresis, constipation, hematuria or back pain. Significant associated medical issues include diverticulitis.  Emesis  Associated symptoms include abdominal pain and diarrhea. Pertinent negatives include no chills, no fever and no headaches.  Diarrhea The primary symptoms include abdominal pain, nausea, vomiting and diarrhea. Primary symptoms do not include fever, dysuria or rash.  The illness does not include chills, constipation or back pain. Significant associated medical issues include diverticulitis.  Pain is sharp. Pain is located right lower quadrant and left lower quadrant. No radiation of pain. Exam multiple nonbloody stools that are loose. Blood in emesis. No bilious emesis. No reported fevers. Moderate in severity. Persistent worsening symptoms.  Past Medical History  Diagnosis Date  . Hyperlipidemia   . Pancreatitis   . Diverticulitis   . Allergy     hay fever  . GERD (gastroesophageal reflux disease)   . BPH (benign prostatic  hyperplasia)   . Hiatal hernia     NO SURGERY FOR THIS  . Arthritis     Past Surgical History  Procedure Date  . Prostate surgery   . 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 Cancer Brother     prostate  . Colon cancer Neg Hx   . Heart disease Father     History  Substance Use Topics  . Smoking status: Former Smoker    Types: Cigarettes    Quit date: 01/07/1965  . Smokeless tobacco: Former Neurosurgeon    Quit date: 12/16/1965  . Alcohol Use: No      Review of Systems  Constitutional: Negative for fever, chills and diaphoresis.  HENT: Negative for neck pain and neck stiffness.   Eyes: Negative for pain.  Respiratory: Negative for shortness of breath.   Cardiovascular: Negative for chest pain and palpitations.  Gastrointestinal: Positive for nausea, vomiting, abdominal pain and diarrhea. Negative for constipation, blood in stool and rectal pain.  Genitourinary: Positive for frequency. Negative for dysuria and hematuria.  Musculoskeletal: Negative for back pain.  Skin: Negative for rash.  Neurological: Negative for headaches.  All other systems reviewed and are negative.    Allergies  Oxycodone-acetaminophen  Home Medications  No current outpatient prescriptions on file.  BP 123/68  Pulse 65  Temp(Src) 98.2 F (36.8 C) (Oral)  Resp 18  Ht 6' (1.829 m)  Wt 164 lb 7.4 oz (74.6 kg)  BMI 22.31 kg/m2  SpO2  96%  Physical Exam  Constitutional: He is oriented to person, place, and time. He appears well-developed and well-nourished.  HENT:  Head: Normocephalic and atraumatic.       Dry mucous membranes  Eyes: Conjunctivae and EOM are normal. Pupils are equal, round, and reactive to light.  Neck: Trachea normal. Neck supple. No thyromegaly present.  Cardiovascular:  Normal rate, regular rhythm, S1 normal, S2 normal and normal pulses.     No systolic murmur is present   No diastolic murmur is present  Pulses:      Radial pulses are 2+ on the right side, and 2+ on the left side.  Pulmonary/Chest: Effort normal and breath sounds normal. He has no wheezes. He has no rhonchi. He has no rales. He exhibits no tenderness.  Abdominal: Soft. Normal appearance and bowel sounds are normal. There is no CVA tenderness and negative Murphy's sign.       Tender to palpation right lower quadrant and left lower quadrant abdomen with mild voluntary guarding and no peritonitis otherwise  Musculoskeletal:       Ambulates with cane. Moves all extremities x4  Neurological: He is alert and oriented to person, place, and time. He has normal strength. No cranial nerve deficit or sensory deficit. GCS eye subscore is 4. GCS verbal subscore is 5. GCS motor subscore is 6.  Skin: Skin is warm and dry. No rash noted. He is not diaphoretic.  Psychiatric: His speech is normal.       Cooperative and appropriate    ED Course  Procedures (including critical care time)  Labs Reviewed  PROTIME-INR - Abnormal; Notable for the following:    Prothrombin Time 25.6 (*)    INR 2.29 (*)    All other components within normal limits  CBC - Abnormal; Notable for the following:    RBC 4.08 (*)    Hemoglobin 11.3 (*)    HCT 35.4 (*)    All other components within normal limits  COMPREHENSIVE METABOLIC PANEL - Abnormal; Notable for the following:    Glucose, Bld 124 (*)    Alkaline Phosphatase 138 (*)    Total Bilirubin 0.2 (*)    GFR calc non Af Amer 80 (*)    All other components within normal limits  URINALYSIS, ROUTINE W REFLEX MICROSCOPIC - Abnormal; Notable for the following:    APPearance CLOUDY (*)    Hgb urine dipstick SMALL (*)    Ketones, ur TRACE (*)    Protein, ur 30 (*)    All other components within normal limits  URINE MICROSCOPIC-ADD ON - Abnormal; Notable for the  following:    Casts HYALINE CASTS (*)    Crystals CA OXALATE CRYSTALS (*)    All other components within normal limits  POCT I-STAT, CHEM 8 - Abnormal; Notable for the following:    Glucose, Bld 124 (*)    Hemoglobin 12.9 (*)    HCT 38.0 (*)    All other components within normal limits  BASIC METABOLIC PANEL - Abnormal; Notable for the following:    Potassium 3.4 (*)    GFR calc non Af Amer 83 (*)    All other components within normal limits  CBC - Abnormal; Notable for the following:    RBC 3.35 (*)    Hemoglobin 9.5 (*)    HCT 28.9 (*)    All other components within normal limits  LIPASE, BLOOD  CLOSTRIDIUM DIFFICILE BY PCR  I-STAT, CHEM 8  CBC  BASIC METABOLIC PANEL  Ct Abdomen Pelvis W Contrast  01/24/2011  *RADIOLOGY REPORT*  Clinical Data: Severe abdominal pain for 3-4 days; nausea and vomiting.  History of pancreatitis, diverticulitis and hiatal hernia.  CT ABDOMEN AND PELVIS WITH CONTRAST  Technique:  Multidetector CT imaging of the abdomen and pelvis was performed following the standard protocol during bolus administration of intravenous contrast.  Contrast: OMNIPAQUE IOHEXOL 300 MG/ML IV SOLN  Comparison: CT of the abdomen and pelvis performed 09/13/2010  Findings: Minimal bibasilar atelectasis is noted.  The liver and spleen are unremarkable in appearance.  The gallbladder is relatively decompressed and is within normal limits. The pancreas and adrenal glands are unremarkable. There is no evidence for pancreatitis.  There is a 2.1 cm cyst noted near the left upper pole of the left kidney.  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  Scattered smaller left renal cysts are seen.  The right kidney is unremarkable in appearance. No hydronephrosis is appreciated.  No renal or ureteral stones are identified.  No perinephric soft tissue stranding is appreciated.  No free fluid is identified.  The small bowel is unremarkable in  appearance.  The stomach is within normal limits.  No acute vascular abnormalities are seen.  Minimal calcification is noted along the abdominal aorta.  The appendix is normal in caliber, without evidence for appendicitis.  Focal colonic wall thickening is noted along the proximal transverse colon.  This is new from the prior study; malignancy cannot be entirely excluded.  There is also more diffuse segmental wall thickening along the proximal sigmoid colon, with scattered associated diverticula. Though no significant associated soft tissue inflammation is seen, this could reflect mild diverticulitis.  The bladder is mildly distended and grossly unremarkable in appearance.  The prostate is enlarged, measuring 5.7 cm in transverse dimension.  No inguinal lymphadenopathy is seen.  No acute osseous abnormalities are identified.  Disc space narrowing and vacuum phenomenon are noted at L5-S1.  IMPRESSION:  1.  Segmental wall thickening along the proximal sigmoid colon, with scattered associated diverticula.  Though no significant associated soft tissue inflammation is seen, findings raise concern for mild diverticulitis. 2.  Focal colonic wall thickening noted along the proximal sigmoid colon.  This is new from the prior study; malignancy cannot be excluded given its appearance.  Following treatment of the acute infectious process, colonoscopy could be considered for further evaluation, as deemed clinically appropriate. 3.  Enlarged prostate. 4.  No evidence of pancreatitis. 5.  Left renal cyst noted.  Original Report Authenticated By: Tonia Ghent, M.D.    Pain control. IV fluids. Labs. CT scan as above. IV antibiotics provided for clinical diverticulitis with CT scan concerning for same. Medicine consult for admit. Case discussed with Dr. Joneen Roach as above who agrees to medical admission.   MDM   71 year old elderly male with diverticular disease, abdominal pain and persistent nausea after Zofran and multiple  stools. Plan IV antibiotics and admit        Sunnie Nielsen, MD 01/26/11 0025

## 2011-01-29 ENCOUNTER — Telehealth: Payer: Self-pay | Admitting: Pulmonary Disease

## 2011-01-29 NOTE — Telephone Encounter (Signed)
Spoke with pt and scheduled appt with SN for 02/07/11 at 2 pm

## 2011-01-29 NOTE — Telephone Encounter (Signed)
Ok to use 1-31 at 2pm for HFU.  thanks

## 2011-01-29 NOTE — Telephone Encounter (Signed)
Per pt's hospital d/c instructions pt is to follow up w/ SN in 1 week. Please advise if pt can be worked in. thanks

## 2011-01-30 DIAGNOSIS — M1710 Unilateral primary osteoarthritis, unspecified knee: Secondary | ICD-10-CM | POA: Diagnosis not present

## 2011-01-30 DIAGNOSIS — M25569 Pain in unspecified knee: Secondary | ICD-10-CM | POA: Diagnosis not present

## 2011-01-30 DIAGNOSIS — Z96659 Presence of unspecified artificial knee joint: Secondary | ICD-10-CM | POA: Diagnosis not present

## 2011-02-01 DIAGNOSIS — M25569 Pain in unspecified knee: Secondary | ICD-10-CM | POA: Diagnosis not present

## 2011-02-01 DIAGNOSIS — M1710 Unilateral primary osteoarthritis, unspecified knee: Secondary | ICD-10-CM | POA: Diagnosis not present

## 2011-02-04 DIAGNOSIS — Z96659 Presence of unspecified artificial knee joint: Secondary | ICD-10-CM | POA: Diagnosis not present

## 2011-02-04 DIAGNOSIS — M1710 Unilateral primary osteoarthritis, unspecified knee: Secondary | ICD-10-CM | POA: Diagnosis not present

## 2011-02-04 DIAGNOSIS — M25569 Pain in unspecified knee: Secondary | ICD-10-CM | POA: Diagnosis not present

## 2011-02-06 DIAGNOSIS — M1710 Unilateral primary osteoarthritis, unspecified knee: Secondary | ICD-10-CM | POA: Diagnosis not present

## 2011-02-06 DIAGNOSIS — M25569 Pain in unspecified knee: Secondary | ICD-10-CM | POA: Diagnosis not present

## 2011-02-06 DIAGNOSIS — Z96659 Presence of unspecified artificial knee joint: Secondary | ICD-10-CM | POA: Diagnosis not present

## 2011-02-07 ENCOUNTER — Encounter: Payer: Self-pay | Admitting: Pulmonary Disease

## 2011-02-07 ENCOUNTER — Ambulatory Visit (INDEPENDENT_AMBULATORY_CARE_PROVIDER_SITE_OTHER): Payer: Medicare Other | Admitting: Pulmonary Disease

## 2011-02-07 DIAGNOSIS — I480 Paroxysmal atrial fibrillation: Secondary | ICD-10-CM

## 2011-02-07 DIAGNOSIS — E7800 Pure hypercholesterolemia, unspecified: Secondary | ICD-10-CM | POA: Diagnosis not present

## 2011-02-07 DIAGNOSIS — K5732 Diverticulitis of large intestine without perforation or abscess without bleeding: Secondary | ICD-10-CM | POA: Diagnosis not present

## 2011-02-07 DIAGNOSIS — K2100 Gastro-esophageal reflux disease with esophagitis, without bleeding: Secondary | ICD-10-CM

## 2011-02-07 DIAGNOSIS — I4891 Unspecified atrial fibrillation: Secondary | ICD-10-CM | POA: Diagnosis not present

## 2011-02-07 DIAGNOSIS — Z87898 Personal history of other specified conditions: Secondary | ICD-10-CM

## 2011-02-07 DIAGNOSIS — M1990 Unspecified osteoarthritis, unspecified site: Secondary | ICD-10-CM

## 2011-02-07 NOTE — Discharge Summary (Signed)
Physician Discharge Summary  Patient ID: Thomas Pearson MRN: [id number] DOB/AGE: Jun 17, 1940 71 y.o.  Admit date: 01/23/2011 Discharge date: 02/07/2011  Primary Care Physician:  Michele Mcalpine, MD, MD Gastroenterologist: Corinda Gubler  Discharge Diagnoses:   1. Diverticulitis 2.  S/P knee replacement 3. Diarrhea resolving 4. BPH 5. Hyperlipidemia 6. history of hiatal hernia 7. history of pancreatitis 8. GERD   Medication List  As of 02/07/2011  9:24 AM   STOP taking these medications         warfarin 7.5 MG tablet         TAKE these medications         aspirin 81 MG tablet   Take 81 mg by mouth daily.      HYDROcodone-acetaminophen 5-325 MG per tablet   Commonly known as: NORCO   Take 1 tablet by mouth every 8 (eight) hours as needed. For pain      omeprazole 20 MG capsule   Commonly known as: PRILOSEC   Take 20 mg by mouth daily.      rosuvastatin 20 MG tablet   Commonly known as: CRESTOR   Take 1 tablet (20 mg total) by mouth at bedtime. Appointment needed for further refills.             Disposition and Follow-up:  PCP in one week  Consults: none   Significant Diagnostic Studies:  Ct Abdomen Pelvis W Contrast 01/24/2011   IMPRESSION:  1.  Segmental wall thickening along the proximal sigmoid colon, with scattered associated diverticula.  Though no significant associated soft tissue inflammation is seen, findings raise concern for mild diverticulitis. 2.  Focal colonic wall thickening noted along the proximal sigmoid colon.  This is new from the prior study; malignancy cannot be excluded given its appearance.  Following treatment of the acute infectious process, colonoscopy could be considered for further evaluation, as deemed clinically appropriate. 3.  Enlarged prostate. 4.  No evidence of pancreatitis. 5.  Left renal cyst noted.  Original Report Authenticated By: Tonia Ghent, M.D.    Brief H and P: Thomas Pearson is a 71 year old male with history of known  diverticulitis presented to the hospital with bilateral lower quadrant abdominal pain which is crampy associated with nausea vomiting and diarrhea. In addition in December 2012 it had a total knee replacement and is on DVT prophylaxis for that.  Hospital Course:  1.Diverticulitis: Treated conservatively with IV fluids, bowel rest, IV ciprofloxacin and Flagyl,  clinically improved with the above regimen. He also had C. difficile PCR done which was negative In addition he also had a colonoscopy through his gastroenterologist in September 2012 which was unremarkable. Is advised to followup with his GI doctor within one month. 2. status post recent left knee arthroplasty: Completed his recommended duration of Coumadin for DVT prophylaxis from his left knee replacement standpoint hence this was discontinued on admission.  Time spent on Discharge:  Signed: Nealie Mchatton [facility]  02/07/2011, 9:24 AM

## 2011-02-07 NOTE — Patient Instructions (Signed)
Today we updated your med list in our EPIC system...    Continue your current medications the same...    Recommend that you take a 1-a-day w/ IRON to help your blood count...  For your hx diverticulitis>    Avoid foods w/ small seeds & nuts...  You have a follow up appt 05/07/11 at 2pm...    I suggest that you come by our labs one morning prior to that visit for your FASTING blood work...     We will be sure that the lab has the order to perform these tests prior to that visit...  Call for any questions.[name] KitchenMarland Kitchen

## 2011-02-07 NOTE — Progress Notes (Signed)
Subjective:    Patient ID: Thomas Pearson, male    DOB: February 03, 1940, 71 y.o.   MRN: [id number]  HPI 71 y/o WM here for a follow up visit>>  ~  June 28, 2009:  I last saw [name] 9/08 for a yearly ROV w/ mult medical problems as listed below & hx HH/ GERD on Nexium, and Hypercholesterolemia on Simva80... he was hospitalized by GI 6/09 w/ idiopathic pancreatitis> Abd Sonar & CT Abd showed inflamm/ edematous changes around the duod, pancreas, & retroperitoneum (there was a duod divertic, GB was OK)... he improved w/ conservative management;  outpt EGD by [facility] 6/09 showed 2cmHH & stricture in distal esoph...  MR Abd 7/09 showed improved pancreatitis/ resolved edema, +duod divertic, norm GB & ducts...  no recurrent problem since then- he knows to avoid alcohol...  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 he uses Tylenol for pain...  he had labs at insurance co 3/11 trying to get add'l life insurance & FLP looked poor off his Simva, and PSA was elevated at 5.52> so he set up this f/u appt.  ~  November 01, 2010:  32mo ROV & he needs medical clearance for left TKR- had recent left knee injury w/ hemarthrosis, eval by DrDeveshwar & Caffrey "It's bone-on-bone" he says;  He had episode abd pain & went to ER 9/12> eval revealed lower abd tenderness, norm labs, CT w/ ?poss early diverticulitis, given cipro/ flagyl/ vicodin; he had f/u appt DrKaplan & symptoms had resolved> Colonoscopy 09/27/10 showed divertics, & several polyps= tubular adenoma & f/u planned 73yrs.     <Hx PAF> on ASA81mg  daily; BP is normal & he denies CP, palpit, dizzy, SOB, edema, etc...    <Chol> on Cres20 & he is not fasting for FLP today- see below, & he will ret for FLP on the Crestor20...    <HH/ Esophagitis> on Prilosec 20mg /d; he denies upper abd pain, N/ V, dysphagia, etc...    <Divertics, Polyps> on stool softener daily; had  bout of diverticulitis 9/12 as above- resolved w/ cipro/ flagyl; subseq f/u DrKaplan w/ Colonoscopy 9/12 revealing divertics, one polyp removed= tubular adenoma & repeat planned 14yrs...    <Hx pancreatitis> no recurrence since his only bout in 2009; he knows to avoid Etoh etc...    <BPH, elev PSA> he had f/u DrBorden 8/12- s/p TURP 2000 for BPH, PSA 4.34 in 2006 w/ neg Bx; PSA 8/11 was 7.03 w/ repeat Bx also neg for malig; PSA by DrBorden was 5.70 (%free=24, risk of Ca in this 71y/o is 15-30%); they plan f/u in 6 months...    <DJD/ DDD> followed by Hetty Blend; we do not have any recent notes from them; Pt states his left knee is bone-on-bone & needs TKR...  ~  February 07, 2011:  73mo ROV & [place] was [facility] 12/21-23 for left TKR by DrCaffrey & he did well in surg, in rehab, on coumadin, etc; now off the coumadin & getting about well, he is making plans for the other knee later 71y/o;  Then he was adm via [facility] 1/16-19 w/ bilat lower abd pain & felt to have mild diverticulitis> CT Abd showed segmental wall thickening in sigmoid, no signif soft tissue inflamm;  LABS showed Hg>11==>9, WBC=9==>5, Chems=wnl, CDiff=neg, UA=clear;  He was treated w/ Cipro/ Flagyl & improved;  Since disch he is about back  to baseline, eating normally, etc... NOTE> he had similar mild episode 9/12 that responded similarly; prev colonoscopy 9/12 showed divertics, one polyp removed, f/u planned 70yrs...          Problem List:   ALLERGIC RHINITIS (ICD-477.9) - he uses OTC antihist Prn + Nasal Saline...  ABNORMAL ELECTROCARDIOGRAM (ICD-794.31) PAROXYSMAL AFIB >> he he transient AFib reported in the ER w/ his acute pancreatitis 6/09> self limited & ret to NSR... otherw EKG's have been normal x PAC's noted intermittently & he is asymptomatic- denies CP, palpit, dizzy, etc... ~  baseline EKGs showed NSR, WNL... ~  Myoview 7/06 showed occas PAC/ PVC, hypertensive response, good exerc tolerance, diaph attenuation w/o definte  scar, no ischemia... ~  EKG 6/11 showed NSR w/ PAC, otherw WNL/ NAD... ~  EKG 10/12 showed NSR, rate 62, otherw WNL/ NAD...  HYPERCHOLESTEROLEMIA (ICD-272.0) - hx hypercholesterolemia prev on Simva80 but this was stopped 6/09 hosp for pancreatitis & pt never followed up... rec to start CRESTOR 20mg /d + diet Rx... ~  FLP 3/04 on diet alone showed TChol 304, TG 130, HDL 53, LDL 225... rec start Simva40. ~  FLP 9/04 on Simva40 showed TChol 205, Tg 103, HDL 50, LDL 135... rec incr to Simva80. ~  FLP 7/06 on diet alone showed TChol 283, TG 111, HDL 48, LDL 209... rec restart Simva80. ~  FLP 11/08 on Simva80 showed TChol 151, TG 68, HDL 50, LDL 87 ~  FLP 3/11 by insurance co on diet alone showed TChol 263, TG 118, HDL 61, LDL 178 ~  6/11:  rec to start CRESTOR 20mg /d + low chol/ low fat diet... ~  Pt never ret for follow up FLP & we will check this prior to his 4/12 f/u visit...  HIATAL HERNIA (ICD-553.3) & ESOPHAGITIS, REFLUX (ICD-530.11) - on OMEPRAZOLE 20mg /d regularly...  ~  EGD 5/05 by [facility] showed HH, gastritis, & treated w/ Nexium 40mg /d... ~  EGD 6/09 by DrKaplan showed 2cmHH, & esoph stricture...  DIVERTICULOSIS, COLON (ICD-562.10), & COLONIC POLYPS (ICD-211.3) ~  colonoscopy 5/05 by [facility] showed divertics, diminutive colon polyps, hems... ~  9/12: ER visit w/ abd pain- CTAbd w/ ?poss mild diverticulitis, treated w/ Cipro/ Flagyl & improved... ~  Subseq f/u eval by Dorris Singh w/ colonoscopy 9/12 revealing divertics & one polyp= tubular adenoma & repeat request in 54yrs. ~  Another bout of mild diverticulitis similar to 9/12 & similarly responded to Cipro, Flagyl, etc...  Hx of ACUTE PANCREATITIS (ICD-577.0) - see 6/09 hospitalization and f/u by DrKaplan... ~  No known recurrence of the pancreatitis, GB work up was neg,?of relation to lipids, he knows to avoid Etoh...  BENIGN PROSTATIC HYPERTROPHY, HX OF (ICD-V13.8)   ELEVATED PROSTATE SPECIFIC ANTIGEN (ICD-790.93) - long hx elevated  PSA's w/ TURP 9/00 by [id number]- benign tissue... follow up yearly PSA's in the 4-5 range and prostate bx 9/06 was also benign...  ~  labs 3/11 by insurance co showed PSA= 5.52 ~  labs 6/11 here showed PSA= 6.30 & [facility]... ~  Eval by DrBorden w/ repeat Bx 8/11- all benign, & they continue to follow his PSAs every 45mo...  DEGENERATIVE JOINT DISEASE (ICD-715.90)  DEGENERATIVE DISC DISEASE, CERVICAL SPINE (ICD-722.4) - followed by DrDeveshwar for Rheum w/ signif DJD knees- s/p Synvisc shots & he's been told he'll need TKR's...  also hx chronic neck pain & DDD> he uses Tylenol, heat, etc...   Past Surgical History  Procedure Date  .  Prostate surgery   . Total knee arthroplasty 12/28/2010    Procedure: TOTAL KNEE ARTHROPLASTY;  Surgeon: Thera Flake., MD;  Location: [facility] OR;  Service: Orthopedics;  Laterality: Left;  left total knee - [phone]    Outpatient Encounter Prescriptions as of 02/07/2011  Medication Sig Dispense Refill  . aspirin 81 MG tablet Take 81 mg by mouth daily.        [name] Kitchen HYDROcodone-acetaminophen (NORCO) 5-325 MG per tablet Take 1 tablet by mouth every 8 (eight) hours as needed. For pain      . omeprazole (PRILOSEC) 20 MG capsule Take 20 mg by mouth daily.       . rosuvastatin (CRESTOR) 20 MG tablet Take 1 tablet (20 mg total) by mouth at bedtime. Appointment needed for further refills.  30 tablet  11    Allergies  Allergen Reactions  . Oxycodone-Acetaminophen     REACTION: hives    Current Medications, Allergies, Past Medical History, Past Surgical History, Family History, and Social History were reviewed in [employer] record.    Review of Systems       The patient complains of joint pain, stiffness, and arthritis.  The patient denies fever, chills, sweats, anorexia, fatigue, weakness, malaise, weight loss, sleep disorder, blurring, diplopia, eye irritation, eye discharge, vision loss, eye pain, photophobia,  earache, ear discharge, tinnitus, decreased hearing, nasal congestion, nosebleeds, sore throat, hoarseness, chest pain, palpitations, syncope, dyspnea on exertion, orthopnea, PND, peripheral edema, cough, dyspnea at rest, excessive sputum, hemoptysis, wheezing, pleurisy, nausea, vomiting, diarrhea, constipation, change in bowel habits, abdominal pain, melena, hematochezia, jaundice, gas/bloating, indigestion/heartburn, dysphagia, odynophagia, dysuria, hematuria, urinary frequency, urinary hesitancy, nocturia, incontinence, back pain, joint swelling, muscle cramps, muscle weakness, sciatica, restless legs, leg pain at night, leg pain with exertion, rash, itching, dryness, suspicious lesions, paralysis, paresthesias, seizures, tremors, vertigo, transient blindness, frequent falls, frequent headaches, difficulty walking, depression, anxiety, memory loss, confusion, cold intolerance, heat intolerance, polydipsia, polyphagia, polyuria, unusual weight change, abnormal bruising, bleeding, enlarged lymph nodes, urticaria, allergic rash, hay fever, and recurrent infections.    Objective:   Physical Exam     WD, WN, 71 y/o WM in NAD... GENERAL:  Alert & oriented; pleasant & cooperative. HEENT:  [place]/AT, EOM-wnl, PERRLA, Fundi-benign, EACs-clear, TMs-wnl, NOSE-clear, THROAT-clear & wnl. NECK:  Supple w/ fairROM; no JVD; normal carotid impulses w/o bruits; no thyromegaly or nodules palpated; no lymphadenopathy. CHEST:  Clear to P & A; without wheezes/ rales/ or rhonchi. HEART:  Regular Rhythm; without murmurs/ rubs/ or gallops detected... ABDOMEN:  Soft & nontender; normal bowel sounds; no organomegaly or masses palpated... EXT: without deformities, +arthritic changes; no varicose veins/ venous insuffic/ or edema. NEURO:  CN's intact; motor testing normal; sensory testing normal; gait normal & balance OK. DERM:  No lesions noted; no rash etc...  RADIOLOGY DATA:  Reviewed in the EPIC EMR & discussed w/ the  patient...  LABORATORY DATA:  Reviewed in the EPIC EMR & discussed w/ the patient...   Assessment & Plan:   <Hx PAF> on ASA81mg  daily; BP is normal & he remains asymptomatic; EKG shows NSR, WNL.[name] Kitchen.     <Chol> on Cres20 & he is not fasting for FLP today- see above, & he will ret for FLP on the Crestor20...     <HH/ Esophagitis> on Prilosec 20mg /d; he remains asymptomatic...     <Divertics, Polyps> on stool softener daily; had bouts of diverticulitis 9/12 & 1/13 as above- resolved w/ cipro/ flagyl; subseq f/u DrKaplan w/ Colonoscopy 9/12  revealing divertics, one polyp removed= tubular adenoma & repeat planned 61yrs...     <Hx pancreatitis> no recurrence since his only bout in 2009; he knows to avoid Etoh etc...     <BPH, elev PSA> he had f/u DrBorden 8/12- s/p TURP 2000 for BPH, elev PSAs w/ Bx's on 2 sep occas & both benign; he continues Q50mo follow up w/ Urology.     <DJD/ DDD> followed by Hetty Blend; we do not have any recent notes from them; Pt states his left knee is bone-on-bone & needs TKR...   Patient's Medications  New Prescriptions   No medications on file  Previous Medications   ASPIRIN 81 MG TABLET    Take 81 mg by mouth daily.     HYDROCODONE-ACETAMINOPHEN (NORCO) 5-325 MG PER TABLET    Take 1 tablet by mouth every 8 (eight) hours as needed. For pain   OMEPRAZOLE (PRILOSEC) 20 MG CAPSULE    Take 20 mg by mouth daily.    ROSUVASTATIN (CRESTOR) 20 MG TABLET    Take 1 tablet (20 mg total) by mouth at bedtime. Appointment needed for further refills.  Modified Medications   No medications on file  Discontinued Medications   No medications on file

## 2011-02-11 ENCOUNTER — Other Ambulatory Visit: Payer: Self-pay | Admitting: Pulmonary Disease

## 2011-02-11 DIAGNOSIS — K5730 Diverticulosis of large intestine without perforation or abscess without bleeding: Secondary | ICD-10-CM

## 2011-02-11 DIAGNOSIS — E7800 Pure hypercholesterolemia, unspecified: Secondary | ICD-10-CM

## 2011-02-11 DIAGNOSIS — M1710 Unilateral primary osteoarthritis, unspecified knee: Secondary | ICD-10-CM | POA: Diagnosis not present

## 2011-02-11 DIAGNOSIS — R972 Elevated prostate specific antigen [PSA]: Secondary | ICD-10-CM

## 2011-02-11 NOTE — Progress Notes (Signed)
Orders only encounter created for labs prior to 05-07-11 ov w/ SN.  Labs ordered are per SN.

## 2011-02-13 DIAGNOSIS — M25569 Pain in unspecified knee: Secondary | ICD-10-CM | POA: Diagnosis not present

## 2011-02-13 DIAGNOSIS — Z96659 Presence of unspecified artificial knee joint: Secondary | ICD-10-CM | POA: Diagnosis not present

## 2011-02-13 DIAGNOSIS — M1710 Unilateral primary osteoarthritis, unspecified knee: Secondary | ICD-10-CM | POA: Diagnosis not present

## 2011-02-15 DIAGNOSIS — M25569 Pain in unspecified knee: Secondary | ICD-10-CM | POA: Diagnosis not present

## 2011-02-15 DIAGNOSIS — M1710 Unilateral primary osteoarthritis, unspecified knee: Secondary | ICD-10-CM | POA: Diagnosis not present

## 2011-02-15 DIAGNOSIS — Z96659 Presence of unspecified artificial knee joint: Secondary | ICD-10-CM | POA: Diagnosis not present

## 2011-02-18 ENCOUNTER — Ambulatory Visit: Payer: Medicare Other | Admitting: Pulmonary Disease

## 2011-02-27 DIAGNOSIS — R972 Elevated prostate specific antigen [PSA]: Secondary | ICD-10-CM | POA: Diagnosis not present

## 2011-03-25 DIAGNOSIS — M1710 Unilateral primary osteoarthritis, unspecified knee: Secondary | ICD-10-CM | POA: Diagnosis not present

## 2011-05-06 ENCOUNTER — Other Ambulatory Visit (INDEPENDENT_AMBULATORY_CARE_PROVIDER_SITE_OTHER): Payer: Medicare Other

## 2011-05-06 DIAGNOSIS — Z299 Encounter for prophylactic measures, unspecified: Secondary | ICD-10-CM

## 2011-05-06 DIAGNOSIS — Z7901 Long term (current) use of anticoagulants: Secondary | ICD-10-CM | POA: Diagnosis not present

## 2011-05-06 DIAGNOSIS — R972 Elevated prostate specific antigen [PSA]: Secondary | ICD-10-CM | POA: Diagnosis not present

## 2011-05-06 DIAGNOSIS — IMO0001 Reserved for inherently not codable concepts without codable children: Secondary | ICD-10-CM

## 2011-05-06 DIAGNOSIS — K5730 Diverticulosis of large intestine without perforation or abscess without bleeding: Secondary | ICD-10-CM | POA: Diagnosis not present

## 2011-05-06 DIAGNOSIS — E7800 Pure hypercholesterolemia, unspecified: Secondary | ICD-10-CM | POA: Diagnosis not present

## 2011-05-06 LAB — CBC WITH DIFFERENTIAL/PLATELET
Basophils Relative: 0.7 % (ref 0.0–3.0)
Eosinophils Relative: 3.7 % (ref 0.0–5.0)
HCT: 43.8 % (ref 39.0–52.0)
Hemoglobin: 14.3 g/dL (ref 13.0–17.0)
Lymphs Abs: 2.5 10*3/uL (ref 0.7–4.0)
Monocytes Relative: 9.9 % (ref 3.0–12.0)
Neutro Abs: 3.1 10*3/uL (ref 1.4–7.7)
WBC: 6.5 10*3/uL (ref 4.5–10.5)

## 2011-05-06 LAB — BASIC METABOLIC PANEL
BUN: 16 mg/dL (ref 6–23)
Calcium: 9.4 mg/dL (ref 8.4–10.5)
GFR: 77.06 mL/min (ref >60.00)
Glucose, Bld: 107 mg/dL — ABNORMAL HIGH (ref 70–99)
Sodium: 143 mEq/L (ref 135–145)

## 2011-05-06 LAB — LIPID PANEL
Cholesterol: 157 mg/dL (ref 0–200)
HDL: 63.3 mg/dL (ref >39.00)
LDL Cholesterol: 77 mg/dL (ref 0–99)
VLDL: 16.4 mg/dL (ref 0.0–40.0)

## 2011-05-06 LAB — HEPATIC FUNCTION PANEL: Albumin: 3.9 g/dL (ref 3.5–5.2)

## 2011-05-06 LAB — TSH: TSH: 1.24 u[IU]/mL (ref 0.35–5.50)

## 2011-05-06 LAB — PROTIME-INR: INR: 1 ratio (ref 0.8–1.0)

## 2011-05-07 ENCOUNTER — Encounter: Payer: Self-pay | Admitting: Pulmonary Disease

## 2011-05-07 ENCOUNTER — Ambulatory Visit (INDEPENDENT_AMBULATORY_CARE_PROVIDER_SITE_OTHER): Payer: Medicare Other | Admitting: Pulmonary Disease

## 2011-05-07 VITALS — BP 138/84 | HR 55 | Temp 97.0°F | Ht 72.0 in | Wt 183.4 lb

## 2011-05-07 DIAGNOSIS — I4891 Unspecified atrial fibrillation: Secondary | ICD-10-CM

## 2011-05-07 DIAGNOSIS — J309 Allergic rhinitis, unspecified: Secondary | ICD-10-CM

## 2011-05-07 DIAGNOSIS — K2100 Gastro-esophageal reflux disease with esophagitis, without bleeding: Secondary | ICD-10-CM

## 2011-05-07 DIAGNOSIS — Z96659 Presence of unspecified artificial knee joint: Secondary | ICD-10-CM

## 2011-05-07 DIAGNOSIS — E7800 Pure hypercholesterolemia, unspecified: Secondary | ICD-10-CM

## 2011-05-07 DIAGNOSIS — I480 Paroxysmal atrial fibrillation: Secondary | ICD-10-CM

## 2011-05-07 DIAGNOSIS — R972 Elevated prostate specific antigen [PSA]: Secondary | ICD-10-CM

## 2011-05-07 DIAGNOSIS — K449 Diaphragmatic hernia without obstruction or gangrene: Secondary | ICD-10-CM

## 2011-05-07 DIAGNOSIS — Z87898 Personal history of other specified conditions: Secondary | ICD-10-CM

## 2011-05-07 DIAGNOSIS — M1990 Unspecified osteoarthritis, unspecified site: Secondary | ICD-10-CM

## 2011-05-07 DIAGNOSIS — K5730 Diverticulosis of large intestine without perforation or abscess without bleeding: Secondary | ICD-10-CM

## 2011-05-07 MED ORDER — OMEPRAZOLE 20 MG PO CPDR: 20.0000 mg | DELAYED_RELEASE_CAPSULE | Freq: Every day | ORAL | Status: DC

## 2011-05-07 NOTE — Progress Notes (Signed)
Subjective:    Patient ID: Thomas Pearson, male    DOB: 11-29-1940, 71 y.o.   MRN: [id number]  HPI 71 y/o WM here for a follow up visit>>  ~  June 28, 2009:  I last saw [name] 9/08 for a yearly ROV w/ mult medical problems as listed below & hx HH/ GERD on Nexium, and Hypercholesterolemia on Simva80... he was hospitalized by GI 6/09 w/ idiopathic pancreatitis> Abd Sonar & CT Abd showed inflamm/ edematous changes around the duod, pancreas, & retroperitoneum (there was a duod divertic, GB was OK)... he improved w/ conservative management;  outpt [facility]GD by [facility] 6[facility]09 showed 2cmHH & stricture in distal esoph...  MR Abd 7/09 showed improved pancreatitis/ resolved edema, +duod divertic, norm GB & ducts...  no recurrent problem since then- he knows to avoid alcohol...  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 he uses Tylenol for pain...  he had labs at insurance co 3/11 trying to get add'l life insurance & FLP looked poor off his Simva, and PSA was elevated at 5.52> so he set up this f/u appt.  ~  November 01, 2010:  55mo ROV & he needs medical clearance for left TKR- had recent left knee injury w/ hemarthrosis, eval by DrDeveshwar & Caffrey "It's bone-on-bone" he says;  He had episode abd pain & went to ER 9/12> eval revealed lower abd tenderness, norm labs, CT w/ ?poss early diverticulitis, given cipro/ flagyl/ vicodin; he had f/u appt DrKaplan & symptoms had resolved> Colonoscopy 09/27/10 showed divertics, & several polyps= tubular adenoma & f/u planned 2yrs.     <Hx PAF> on ASA81mg  daily; BP is normal & he denies CP, palpit, dizzy, SOB, edema, etc...    <Chol> on Cres20 & he is not fasting for FLP today- see below, & he will ret for FLP on the Crestor20...    <HH/ Esophagitis> on Prilosec 20mg /d; he denies upper abd pain, N/ V, dysphagia, etc...    <Divertics, Polyps> on stool softener daily; had  bout of diverticulitis 9/12 as above- resolved w/ cipro/ flagyl; subseq f/u DrKaplan w/ Colonoscopy 9/12 revealing divertics, one polyp removed= tubular adenoma & repeat planned 21yrs...    <Hx pancreatitis> no recurrence since his only bout in 2009; he knows to avoid Etoh etc...    <BPH, elev PSA> he had f/u DrBorden 8/12- s/p TURP 2000 for BPH, PSA 4.34 in 2006 w/ neg Bx; PSA 8/11 was 7.03 w/ repeat Bx also neg for malig; PSA by DrBorden was 5.70 (%free=24, risk of Ca in this 71y/o is 15-30%); they plan f/u in 6 months...    <DJD/ DDD> followed by Hetty Blend; we do not have any recent notes from them; Pt states his left knee is bone-on-bone & needs TKR...  ~  February 07, 2011:  13mo ROV & [place] was [facility] 12/21-23 for left TKR by DrCaffrey & he did well in surg, in rehab, on coumadin, etc; now off the coumadin & getting about well, he is making plans for the other knee later this yr;  Then he was adm via [facility] 1/16-19 w/ bilat lower abd pain & felt to have mild diverticulitis> CT Abd showed segmental wall thickening in sigmoid, no signif soft tissue inflamm;  LABS showed Hg>11==>9, WBC=9==>5, Chems=wnl, CDiff=neg, UA=clear;  He was treated w/ Cipro/ Flagyl & improved;  Since disch he is about back  to baseline, eating normally, etc... NOTE> he had similar mild episode 9/12 that responded similarly; prev colonoscopy 9/12 showed divertics, one polyp removed, f/u planned 38yrs...  ~  May 07, 2011:  6mo ROV &  [name] has had a stable interval- no new complaints or concerns; he has recovered from his left TKR12/12 by DrCaffrey;  Similarly he has not had any recurrent divertic symptoms- no pain, no n/v etc; he notes his breathing is good & he denies CP, palpit, dizzy, SOB, edema, etc; he is planning right TKR 12/13...  See prob list below>> LABS 4/13:  FLP- at goals on Cres20;  Chems- ok;  CBC- wnl w/ Hg=14.3;  TSH=1.24;  PSA=6.73...   Problem List:    << PROBLEM LIST UPDATED 05/07/11 >>  ALLERGIC  RHINITIS (ICD-477.9) - he uses OTC antihist Prn + Nasal Saline...  ABNORMAL ELECTROCARDIOGRAM (ICD-794.31) PAROXYSMAL AFIB >> he he transient AFib reported in the ER w/ his acute pancreatitis 6/09> self limited & ret to NSR... otherw EKG's have been normal x PAC's noted intermittently & he is asymptomatic- denies CP, palpit, dizzy, etc... ~  baseline EKGs showed NSR, WNL... ~  Myoview 7/06 showed occas PAC/ PVC, hypertensive response, good exerc tolerance, diaph attenuation w/o definte scar, no ischemia... ~  EKG 6/11 showed NSR w/ PAC, otherw WNL/ NAD.[name] Kitchen. ~  CXR 6/11 showed normal heart size, clear lungs, sl hyperinflation, NAD... ~  EKG 10/12 showed NSR, rate62, otherw WNL/ NAD...  HYPERCHOLESTEROLEMIA (ICD-272.0) - hx hypercholesterolemia prev on Simva80 but this was stopped 6/09 hosp for pancreatitis & pt never followed up... rec to start CRESTOR 20mg /d + diet Rx... ~  FLP 3/04 on diet alone showed TChol 304, TG 130, HDL 53, LDL 225... rec start Simva40. ~  FLP 9/04 on Simva40 showed TChol 205, Tg 103, HDL 50, LDL 135... rec incr to Simva80. ~  FLP 7/06 on diet alone showed TChol 283, TG 111, HDL 48, LDL 209... rec restart Simva80. ~  FLP 11/08 on Simva80 showed TChol 151, TG 68, HDL 50, LDL 87 ~  FLP 3/11 by insurance co on diet alone showed TChol 263, TG 118, HDL 61, LDL 178 ~  6/11:  rec to start CRESTOR 20mg /d + low chol/ low fat diet... ~  Pt never ret for follow up FLP on the Cres20... ~  FLP 4/13 on Cres20 showed TChol 157, TG 82, HDL 63, LDL 77... Continue same.  HIATAL HERNIA (ICD-553.3) & ESOPHAGITIS, REFLUX (ICD-530.11) - on OMEPRAZOLE 20mg /d regularly...  ~  EGD 5/05 by [facility] showed HH, gastritis, & treated w/ Nexium 40mg /d... ~  EGD 6/09 by DrKaplan showed 2cmHH, & esoph stricture...  DIVERTICULOSIS, COLON (ICD-562.10), & COLONIC POLYPS (ICD-211.3) ~  colonoscopy 5/05 by [facility] showed divertics, diminutive colon polyps, hems... ~  9/12: ER visit w/ abd pain- CTAbd w/  ?poss mild diverticulitis, treated w/ Cipro/ Flagyl & improved... ~  Subseq f/u eval by Dorris Singh w/ colonoscopy 9/12 revealing divertics & one polyp= tubular adenoma & repeat request in 41yrs. ~  1/13: another bout of mild diverticulitis similar to 9/12 & similarly responded to Cipro, Flagyl, etc... ~  CTAbd&Pelvis 1/13 showed segmental wall thickening in prox sigmoid & prox transverse colon- ?mild diverticulitis; mult left renal cysts, min Aortic calcif, enlarged prostate, disc space narrowing in lower lumbar area... ~  4/13:  All symptoms have resolved & he denies abd pain, etc...  Hx of ACUTE PANCREATITIS (ICD-577.0) - see 6/09 hospitalization and f/u by DrKaplan... ~  No known recurrence  of the pancreatitis, GB work up was neg,?of relation to lipids, he knows to avoid Etoh...  BENIGN PROSTATIC HYPERTROPHY, HX OF (ICD-V13.8)   ELEVATED PROSTATE SPECIFIC ANTIGEN (ICD-790.93) - long hx elevated PSA's w/ TURP 9/00 by [id number]- benign tissue... follow up yearly PSA's in the 4-5 range and prostate bx 9/06 was also benign...  ~  labs 3/11 by insurance co showed PSA= 5.52 ~  labs 6/11 here showed PSA= 6.30 & [facility]... ~  Eval by DrBorden w/ repeat Bx 8/11- all benign, & they continue to follow his PSAs every 76mo... ~  Labs 4/13 here showed PSA= 6.73  DEGENERATIVE JOINT DISEASE (ICD-715.90)  DEGENERATIVE DISC DISEASE, CERVICAL SPINE (ICD-722.4) - followed by DrDeveshwar for Rheum w/ signif DJD knees- s/p Synvisc shots & he's been told he'll need TKR's...  also hx chronic neck pain & DDD> he uses Tylenol, heat, etc... ~  12/12:  S/p left TKR by drCaffrey; he did well post op w/ rehab etc...   Past Surgical History  Procedure Date  . Prostate surgery   . Total knee arthroplasty 12/28/2010    Procedure: TOTAL KNEE ARTHROPLASTY;  Surgeon: Thera Flake., MD;  Location: [facility] OR;  Service: Orthopedics;  Laterality: Left;  left total knee - [phone]    Outpatient  Encounter Prescriptions as of 05/07/2011  Medication Sig Dispense Refill  . aspirin 81 MG tablet Take 81 mg by mouth daily.        [name] Kitchen HYDROcodone-acetaminophen (NORCO) 5-325 MG per tablet Take 1 tablet by mouth every 8 (eight) hours as needed. For pain      . omeprazole (PRILOSEC) 20 MG capsule Take 20 mg by mouth daily.       . rosuvastatin (CRESTOR) 20 MG tablet Take 1 tablet (20 mg total) by mouth at bedtime. Appointment needed for further refills.  30 tablet  11    Allergies  Allergen Reactions  . Oxycodone-Acetaminophen     REACTION: hives    Current Medications, Allergies, Past Medical History, Past Surgical History, Family History, and Social History were reviewed in [employer] record.    Review of Systems       The patient complains of joint pain, stiffness, and arthritis.  The patient denies fever, chills, sweats, anorexia, fatigue, weakness, malaise, weight loss, sleep disorder, blurring, diplopia, eye irritation, eye discharge, vision loss, eye pain, photophobia, earache, ear discharge, tinnitus, decreased hearing, nasal congestion, nosebleeds, sore throat, hoarseness, chest pain, palpitations, syncope, dyspnea on exertion, orthopnea, PND, peripheral edema, cough, dyspnea at rest, excessive sputum, hemoptysis, wheezing, pleurisy, nausea, vomiting, diarrhea, constipation, change in bowel habits, abdominal pain, melena, hematochezia, jaundice, gas/bloating, indigestion/heartburn, dysphagia, odynophagia, dysuria, hematuria, urinary frequency, urinary hesitancy, nocturia, incontinence, back pain, joint swelling, muscle cramps, muscle weakness, sciatica, restless legs, leg pain at night, leg pain with exertion, rash, itching, dryness, suspicious lesions, paralysis, paresthesias, seizures, tremors, vertigo, transient blindness, frequent falls, frequent headaches, difficulty walking, depression, anxiety, memory loss, confusion, cold intolerance, heat intolerance,  polydipsia, polyphagia, polyuria, unusual weight change, abnormal bruising, bleeding, enlarged lymph nodes, urticaria, allergic rash, hay fever, and recurrent infections.    Objective:   Physical Exam     WD, WN, 71 y/o WM in NAD... GENERAL:  Alert & oriented; pleasant & cooperative. HEENT:  [place]/AT, EOM-wnl, PERRLA, Fundi-benign, EACs-clear, TMs-wnl, NOSE-clear, THROAT-clear & wnl. NECK:  Supple w/ fairROM; no JVD; normal carotid impulses w/o bruits; no thyromegaly or nodules palpated; no lymphadenopathy. CHEST:  Clear to P & A; without wheezes/ rales/ or rhonchi. HEART:  Regular Rhythm; without murmurs/ rubs/ or gallops detected... ABDOMEN:  Soft & nontender; normal bowel sounds; no organomegaly or masses palpated... EXT: without deformities, +arthritic changes; no varicose veins/ venous insuffic/ or edema. NEURO:  CN's intact; motor testing normal; sensory testing normal; gait normal & balance OK. DERM:  No lesions noted; no rash etc...  RADIOLOGY DATA:  Reviewed in the EPIC EMR & discussed w/ the patient...  LABORATORY DATA:  Reviewed in the EPIC EMR & discussed w/ the patient...   Assessment & Plan:   <Hx PAF> on ASA81mg  daily; BP is normal & he remains asymptomatic; EKG shows NSR, WNL.[name] Kitchen.     <Chol> on Cres20 & FLP is at goals; continue same med + diet...     <HH/ Esophagitis> on Prilosec 20mg /d; he remains asymptomatic...     <Divertics, Polyps> on stool softener daily; had bouts of diverticulitis 9/12 & 1/13 as above- resolved w/ cipro/ flagyl; he sees DrKaplan w/ Colonoscopy 9/12 revealing divertics, one polyp removed= tubular adenoma & repeat planned 70yrs...     <Hx pancreatitis> no recurrence since his only bout in 2009; he knows to avoid Etoh etc...     <BPH, elev PSA> he had f/u DrBorden 8/12- s/p TURP 2000 for BPH, elev PSAs w/ Bx's on 2 sep occas & both benign; he continues Q48mo follow up w/ Urology.     <DJD/ DDD> followed by Hetty Blend; s/p left TKR 12/12  & he is planning right TKR 12/13...   Patient's Medications  New Prescriptions   No medications on file  Previous Medications   ASPIRIN 81 MG TABLET    Take 81 mg by mouth daily.     HYDROCODONE-ACETAMINOPHEN (NORCO) 5-325 MG PER TABLET    Take 1 tablet by mouth every 8 (eight) hours as needed. For pain   OMEPRAZOLE (PRILOSEC) 20 MG CAPSULE    Take 20 mg by mouth daily.    ROSUVASTATIN (CRESTOR) 20 MG TABLET    Take 1 tablet (20 mg total) by mouth at bedtime. Appointment needed for further refills.  Modified Medications   No medications on file  Discontinued Medications   No medications on file

## 2011-05-07 NOTE — Patient Instructions (Signed)
Today we updated your med list in our EPIC system...    Continue your current medications the same...    We refilled your meds per request...  We reviewed your recent labs & gave you a copy for your records...    Show your PSA reading to DrBorden when you see him next...  Call for any questions...  Let's plan a follow up visit in 6 months.[name] KitchenMarland Kitchen

## 2011-06-12 DIAGNOSIS — R972 Elevated prostate specific antigen [PSA]: Secondary | ICD-10-CM | POA: Diagnosis not present

## 2011-06-12 DIAGNOSIS — N400 Enlarged prostate without lower urinary tract symptoms: Secondary | ICD-10-CM | POA: Diagnosis not present

## 2011-06-27 DIAGNOSIS — M1710 Unilateral primary osteoarthritis, unspecified knee: Secondary | ICD-10-CM | POA: Diagnosis not present

## 2011-08-06 DIAGNOSIS — H524 Presbyopia: Secondary | ICD-10-CM | POA: Diagnosis not present

## 2011-08-06 DIAGNOSIS — H40009 Preglaucoma, unspecified, unspecified eye: Secondary | ICD-10-CM | POA: Diagnosis not present

## 2011-11-05 ENCOUNTER — Other Ambulatory Visit: Payer: Self-pay | Admitting: *Deleted

## 2011-11-05 MED ORDER — ROSUVASTATIN CALCIUM 20 MG PO TABS: 20.0000 mg | ORAL_TABLET | Freq: Every day | ORAL | Status: DC

## 2011-11-14 ENCOUNTER — Other Ambulatory Visit: Payer: Self-pay | Admitting: Pulmonary Disease

## 2011-11-14 ENCOUNTER — Other Ambulatory Visit (INDEPENDENT_AMBULATORY_CARE_PROVIDER_SITE_OTHER): Payer: Medicare Other

## 2011-11-14 DIAGNOSIS — R197 Diarrhea, unspecified: Secondary | ICD-10-CM

## 2011-11-14 DIAGNOSIS — E7800 Pure hypercholesterolemia, unspecified: Secondary | ICD-10-CM

## 2011-11-14 LAB — BASIC METABOLIC PANEL
CO2: 30 mEq/L (ref 19–32)
Chloride: 102 mEq/L (ref 96–112)
Creatinine, Ser: 1 mg/dL (ref 0.4–1.5)

## 2011-11-14 LAB — HEPATIC FUNCTION PANEL
ALT: 17 U/L (ref 0–53)
AST: 21 U/L (ref 0–37)
Albumin: 3.9 g/dL (ref 3.5–5.2)
Alkaline Phosphatase: 100 U/L (ref 39–117)
Bilirubin, Direct: 0.1 mg/dL (ref 0.0–0.3)
Total Bilirubin: 0.4 mg/dL (ref 0.3–1.2)
Total Protein: 7.4 g/dL (ref 6.0–8.3)

## 2011-11-19 ENCOUNTER — Encounter: Payer: Self-pay | Admitting: *Deleted

## 2011-11-19 DIAGNOSIS — M1710 Unilateral primary osteoarthritis, unspecified knee: Secondary | ICD-10-CM | POA: Diagnosis not present

## 2011-11-20 ENCOUNTER — Ambulatory Visit (INDEPENDENT_AMBULATORY_CARE_PROVIDER_SITE_OTHER)
Admission: RE | Admit: 2011-11-20 | Discharge: 2011-11-20 | Disposition: A | Discharge status: Home / Self Care | Payer: Medicare Other | Source: Ambulatory Visit | Attending: Pulmonary Disease | Admitting: Pulmonary Disease

## 2011-11-20 ENCOUNTER — Encounter: Payer: Self-pay | Admitting: Pulmonary Disease

## 2011-11-20 ENCOUNTER — Ambulatory Visit (INDEPENDENT_AMBULATORY_CARE_PROVIDER_SITE_OTHER): Payer: Medicare Other | Admitting: Pulmonary Disease

## 2011-11-20 VITALS — BP 138/84 | HR 60 | Temp 97.0°F | Ht 72.0 in | Wt 186.0 lb

## 2011-11-20 DIAGNOSIS — I4891 Unspecified atrial fibrillation: Secondary | ICD-10-CM | POA: Diagnosis not present

## 2011-11-20 DIAGNOSIS — D126 Benign neoplasm of colon, unspecified: Secondary | ICD-10-CM

## 2011-11-20 DIAGNOSIS — Z23 Encounter for immunization: Secondary | ICD-10-CM

## 2011-11-20 DIAGNOSIS — Z87898 Personal history of other specified conditions: Secondary | ICD-10-CM

## 2011-11-20 DIAGNOSIS — K5730 Diverticulosis of large intestine without perforation or abscess without bleeding: Secondary | ICD-10-CM

## 2011-11-20 DIAGNOSIS — M25569 Pain in unspecified knee: Secondary | ICD-10-CM | POA: Diagnosis not present

## 2011-11-20 DIAGNOSIS — M1990 Unspecified osteoarthritis, unspecified site: Secondary | ICD-10-CM

## 2011-11-20 DIAGNOSIS — I480 Paroxysmal atrial fibrillation: Secondary | ICD-10-CM

## 2011-11-20 DIAGNOSIS — K449 Diaphragmatic hernia without obstruction or gangrene: Secondary | ICD-10-CM

## 2011-11-20 DIAGNOSIS — E7800 Pure hypercholesterolemia, unspecified: Secondary | ICD-10-CM | POA: Diagnosis not present

## 2011-11-20 DIAGNOSIS — M25561 Pain in right knee: Secondary | ICD-10-CM

## 2011-11-20 DIAGNOSIS — M5030 Other cervical disc degeneration, unspecified cervical region: Secondary | ICD-10-CM

## 2011-11-20 DIAGNOSIS — Z01818 Encounter for other preprocedural examination: Secondary | ICD-10-CM | POA: Diagnosis not present

## 2011-11-20 NOTE — Patient Instructions (Addendum)
Today we updated your med list in our EPIC system...    Continue your current medications the same...  Today we did a follow up CXR & EKG as part of the pre-op assessment...    We will contact you w/ the results and send copies to DrCaffrey...   We gave you the 2013 Flu vaccine today...  Call for any questions, and good luck w/ the knee surg.[name] KitchenMarland Kitchen

## 2011-11-20 NOTE — Progress Notes (Signed)
Subjective:    Patient ID: Thomas Pearson, male    DOB: 14-Feb-1940, 71 y.o.   MRN: [id number]  HPI 71 y/o WM here for a follow up visit>>  ~  June 28, 2009:  I last saw Thomas Pearson 9/08 for a yearly ROV w/ mult medical problems as listed below & hx HH/ GERD on Nexium, and Hypercholesterolemia on Simva80... he was hospitalized by GI 6/09 w/ idiopathic pancreatitis> Abd Sonar & CT Abd showed inflamm/ edematous changes around the duod, pancreas, & retroperitoneum (there was a duod divertic, GB was OK)... he improved w/ conservative management;  outpt EGD by [facility] 6/09 showed 2cmHH & stricture in distal esoph...  MR Abd 7/09 showed improved pancreatitis/ resolved edema, +duod divertic, norm GB & ducts...  no recurrent problem since then- he knows to avoid alcohol...  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 he uses Tylenol for pain...  he had labs at insurance co 3/11 trying to get add'l life insurance & FLP looked poor off his Simva, and PSA was elevated at 5.52> so he set up this f/u appt.  ~  November 01, 2010:  47mo ROV & he needs medical clearance for left TKR- had recent left knee injury w/ hemarthrosis, eval by DrDeveshwar & Caffrey "It's bone-on-bone" he says;  He had episode abd pain & went to ER 9/12> eval revealed lower abd tenderness, norm labs, CT w/ ?poss early diverticulitis, given cipro/ flagyl/ vicodin; he had f/u appt DrKaplan & symptoms had resolved> Colonoscopy 09/27/10 showed divertics, & several polyps= tubular adenoma & f/u planned 52yrs.     <Hx PAF> on ASA81mg  daily; BP is normal & he denies CP, palpit, dizzy, SOB, edema, etc...    <Chol> on Cres20 & he is not fasting for FLP today- see below, & he will ret for FLP on the Crestor20...    <HH/ Esophagitis> on Prilosec 20mg /d; he denies upper abd pain, N/ V, dysphagia, etc...    <Divertics, Polyps> on stool softener daily; had  bout of diverticulitis 9/12 as above- resolved w/ cipro/ flagyl; subseq f/u DrKaplan w/ Colonoscopy 9/12 revealing divertics, one polyp removed= tubular adenoma & repeat planned 26yrs...    <Hx pancreatitis> no recurrence since his only bout in 2009; he knows to avoid Etoh etc...    <BPH, elev PSA> he had f/u DrBorden 8/12- s/p TURP 2000 for BPH, PSA 4.34 in 2006 w/ neg Bx; PSA 8/11 was 7.03 w/ repeat Bx also neg for malig; PSA by DrBorden was 5.70 (%free=24, risk of Ca in this 71y/o is 15-30%); they plan f/u in 6 months...    <DJD/ DDD> followed by Het[facility] Blend; we do not have any recent notes from them; Pt states his left knee is bone-on-bone & needs TKR...  ~  February 07, 2011:  38mo ROV & Thomas Pearson was [facility] 12/21-23 for left TKR by DrCaffrey & he did well in surg, in rehab, on coumadin, etc; now off the coumadin & getting about well, he is making plans for the other knee later this yr;  Then he was adm via [facility] 1/16-19 w/ bilat lower abd pain & felt to have mild diverticulitis> CT Abd showed segmental wall thickening in sigmoid, no signif soft tissue inflamm;  LABS showed Hg>11==>9, WBC=9==>5, Chems=wnl, CDiff=neg, UA=clear;  He was treated w/ Cipro/ Flagyl & improved;  Since disch he is about back  to baseline, eating normally, etc... NOTE> he had similar mild episode 9/12 that responded similarly; prev colonoscopy 9/12 showed divertics, one polyp removed, f/u planned 30yrs...  ~  May 07, 2011:  32mo ROV &  Thomas Pearson has had a stable interval- no new complaints or concerns; he has recovered from his left TKR12/12 by DrCaffrey;  Similarly he has not had any recurrent divertic symptoms- no pain, no n/v etc; he notes his breathing is good & he denies CP, palpit, dizzy, SOB, edema, etc; he is planning right TKR 12/13...  See prob list below>> LABS 4/13:  FLP- at goals on Cres20;  Chems- ok;  CBC- wnl w/ Hg=14.3;  TSH=1.24;  PSA=6.73...  ~  November 20, 2011:  6-28mo ROV & Thomas Pearson is here for a pre-op  medical clearance prior to his proposed Right TKR by DrCaffrey sched for 12/27/11...     <Hx PAF> on ASA81mg  daily; BP is normal & he denies CP, palpit, dizzy, SOB, edema, etc...    <Chol> on Cres20 & last FLP 4/13 showed TChol 157, TG 82, HDL 63, LDL 77    <HH/ Esophagitis> on Prilosec 20mg /d; he denies upper abd pain, N/ V, dysphagia, etc...    <Divertics, Polyps> on stool softener daily; had bout of diverticulitis 9/12 as above- resolved w/ cipro/ flagyl; subseq f/u DrKaplan w/ Colonoscopy 9/12 revealing divertics, one polyp removed= tubular adenoma & repeat planned 4yrs...    <Hx pancreatitis> no recurrence since his only bout in 2009; he knows to avoid Etoh etc...    <BPH, elev PSA> he had f/u DrBorden 8/12- s/p TURP 2000 for BPH, PSA 4.34 in 2006 w/ neg Bx; PSA 8/11 was 7.03 w/ repeat Bx also neg for malig; PSA by DrBorden was 5.70 (%free=24, risk of Ca in this 71y/o is 15-30%); they plan f/u in 6 months...    <DJD/ DDD> followed by Hetty Blend; we do not have any recent notes from them; he had left TKR 12/12 & he did very well; now planning right TKR 12/13...  We reviewed prob list, meds, xrays and labs> see below for updates >> OK Flu shot today... CXR 11/13 showed normal heart size, clear lungs, mild thoracic spondy, NAD.[name] KitchenMarland Kitchen EKG 11/13 showed SBrady, rate56, WNL, NAD... Labs 4/13 reviewed & pre-op labs to be done prior to the actual surg 12/13...         Problem List:     ALLERGIC RHINITIS (ICD-477.9) - he uses OTC antihist Prn + Nasal Saline...  ABNORMAL ELECTROCARDIOGRAM (ICD-794.31) PAROXYSMAL AFIB >> he he transient AFib reported in the ER w/ his acute pancreatitis 6/09> self limited & ret to NSR... otherw EKG's have been normal x PAC's noted intermittently & he is asymptomatic- denies CP, palpit, dizzy, etc... ~  baseline EKGs showed NSR, WNL... ~  Myoview 7/06 showed occas PAC/ PVC, hypertensive response, good exerc tolerance, diaph attenuation w/o definte scar, no  ischemia... ~  EKG 6/11 showed NSR w/ PAC, otherw WNL/ NAD.[name] Kitchen. ~  CXR 6/11 showed normal heart size, clear lungs, sl hyperinflation, NAD... ~  EKG 10/12 showed NSR, rate62, otherw WNL/ NAD.[name] Kitchen. ~  CXR 11/13 showed normal heart size, clear lungs, mild thoracic spondy, NAD.[name] Kitchen ~  EKG 11/13 showed SBrady, rate56, WNL, NAD...  HYPERCHOLESTEROLEMIA (ICD-272.0) - hx hypercholesterolemia prev on Simva80 but this was stopped 6/09 hosp for pancreatitis & pt never followed up... rec to start CRESTOR 20mg /d + diet Rx... ~  FLP 3/04 on diet alone showed TChol 304, TG 130, HDL 53,  LDL 225... rec start Simva40. ~  FLP 9/04 on Simva40 showed TChol 205, Tg 103, HDL 50, LDL 135... rec incr to Simva80. ~  FLP 7/06 on diet alone showed TChol 283, TG 111, HDL 48, LDL 209... rec restart Simva80. ~  FLP 11/08 on Simva80 showed TChol 151, TG 68, HDL 50, LDL 87 ~  FLP 3/11 by insurance co on diet alone showed TChol 263, TG 118, HDL 61, LDL 178 ~  6/11:  rec to start CRESTOR 20mg /d + low chol/ low fat diet... ~  Pt never ret for follow up FLP on the Cres20... ~  FLP 4/13 on Cres20 showed TChol 157, TG 82, HDL 63, LDL 77... Continue same.  HIATAL HERNIA (ICD-553.3) & ESOPHAGITIS, REFLUX (ICD-530.11) - on OMEPRAZOLE 20mg /d regularly...  ~  EGD 5/05 by [facility] showed HH, gastritis, & treated w/ Nexium 40mg /d... ~  EGD 6/09 by DrKaplan showed 2cmHH, & esoph stricture...  DIVERTICULOSIS, COLON (ICD-562.10), & COLONIC POLYPS (ICD-211.3) ~  colonoscopy 5/05 by [facility] showed divertics, diminutive colon polyps, hems... ~  9/12: ER visit w/ abd pain- CTAbd w/ ?poss mild diverticulitis, treated w/ Cipro/ Flagyl & improved... ~  Subseq f/u eval by Dorris Singh w/ colonoscopy 9/12 revealing divertics & one polyp= tubular adenoma & repeat request in 25yrs. ~  1/13: another bout of mild diverticulitis similar to 9/12 & similarly responded to Cipro, Flagyl, etc... ~  CTAbd&Pelvis 1/13 showed segmental wall thickening in prox sigmoid &  prox transverse colon- ?mild diverticulitis; mult left renal cysts, min Aortic calcif, enlarged prostate, disc space narrowing in lower lumbar area... ~  4/13:  All symptoms have resolved & he denies abd pain, etc...  Hx of ACUTE PANCREATITIS (ICD-577.0) - see 6/09 hospitalization and f/u by DrKaplan... ~  No known recurrence of the pancreatitis, GB work up was neg,?of relation to lipids, he knows to avoid Etoh...  BENIGN PROSTATIC HYPERTROPHY, HX OF (ICD-V13.8)   ELEVATED PROSTATE SPECIFIC ANTIGEN (ICD-790.93) - long hx elevated PSA's w/ TURP 9/00 by [id number]- benign tissue... follow up yearly PSA's in the 4-5 range and prostate bx 9/06 was also benign...  ~  labs 3/11 by insurance co showed PSA= 5.52 ~  labs 6/11 here showed PSA= 6.30 & [facility]... ~  Eval by DrBorden w/ repeat Bx 8/11- all benign, & they continue to follow his PSAs every 81mo... ~  Labs 4/13 here showed PSA= 6.73 & he continues regular f/u w/ Urology...  DEGENERATIVE JOINT DISEASE (ICD-715.90)  DEGENERATIVE DISC DISEASE, CERVICAL SPINE (ICD-722.4) - followed by DrDeveshwar for Rheum w/ signif DJD knees- s/p Synvisc shots & he's been told he'll need TKR's...  also hx chronic neck pain & DDD> he uses Tylenol, heat, etc... ~  12/12:  S/p left TKR by drCaffrey; he did well post op w/ rehab etc... ~  11/13:  Here for pre-op check 7 med clearance in advance of his planned right TKR 12/13...   Past Surgical History  Procedure Date  . Prostate surgery   . Total knee arthroplasty 12/28/2010    Procedure: TOTAL KNEE ARTHROPLASTY;  Surgeon: Thera Flake., MD;  Location: [facility] OR;  Service: Orthopedics;  Laterality: Left;  left total knee - [phone]    Outpatient Encounter Prescriptions as of 11/20/2011  Medication Sig Dispense Refill  . aspirin 81 MG tablet Take 81 mg by mouth daily.        [name] Kitchen docusate sodium (COLACE) 100 MG capsule Take 100  mg by mouth 2 (two) times daily.      [name] Kitchen  HYDROcodone-acetaminophen (NORCO) 5-325 MG per tablet Take 1 tablet by mouth every 8 (eight) hours as needed. For pain      . omeprazole (PRILOSEC) 20 MG capsule Take 1 capsule (20 mg total) by mouth daily.  90 capsule  3  . rosuvastatin (CRESTOR) 20 MG tablet Take 20 mg by mouth at bedtime.      . [DISCONTINUED] rosuvastatin (CRESTOR) 20 MG tablet Take 1 tablet (20 mg total) by mouth at bedtime. Appointment needed for further refills.  30 tablet  5    Allergies  Allergen Reactions  . Oxycodone-Acetaminophen     REACTION: hives    Current Medications, Allergies, Past Medical History, Past Surgical History, Family History, and Social History were reviewed in [employer] record.    Review of Systems       The patient complains of joint pain, stiffness, and arthritis.  The patient denies fever, chills, sweats, anorexia, fatigue, weakness, malaise, weight loss, sleep disorder, blurring, diplopia, eye irritation, eye discharge, vision loss, eye pain, photophobia, earache, ear discharge, tinnitus, decreased hearing, nasal congestion, nosebleeds, sore throat, hoarseness, chest pain, palpitations, syncope, dyspnea on exertion, orthopnea, PND, peripheral edema, cough, dyspnea at rest, excessive sputum, hemoptysis, wheezing, pleurisy, nausea, vomiting, diarrhea, constipation, change in bowel habits, abdominal pain, melena, hematochezia, jaundice, gas/bloating, indigestion/heartburn, dysphagia, odynophagia, dysuria, hematuria, urinary frequency, urinary hesitancy, nocturia, incontinence, back pain, joint swelling, muscle cramps, muscle weakness, sciatica, restless legs, leg pain at night, leg pain with exertion, rash, itching, dryness, suspicious lesions, paralysis, paresthesias, seizures, tremors, vertigo, transient blindness, frequent falls, frequent headaches, difficulty walking, depression, anxiety, memory loss, confusion, cold intolerance, heat intolerance, polydipsia, polyphagia,  polyuria, unusual weight change, abnormal bruising, bleeding, enlarged lymph nodes, urticaria, allergic rash, hay fever, and recurrent infections.    Objective:   Physical Exam     WD, WN, 71 y/o WM in NAD... GENERAL:  Alert & oriented; pleasant & cooperative. HEENT:  [place]/AT, EOM-wnl, PERRLA, Fundi-benign, EACs-clear, TMs-wnl, NOSE-clear, THROAT-clear & wnl. NECK:  Supple w/ fairROM; no JVD; normal carotid impulses w/o bruits; no thyromegaly or nodules palpated; no lymphadenopathy. CHEST:  Clear to P & A; without wheezes/ rales/ or rhonchi. HEART:  Regular Rhythm; without murmurs/ rubs/ or gallops detected... ABDOMEN:  Soft & nontender; normal bowel sounds; no organomegaly or masses palpated... EXT: without deformities, +arthritic changes; no varicose veins/ venous insuffic/ or edema. NEURO:  CN's intact; motor testing normal; sensory testing normal; gait normal & balance OK. DERM:  No lesions noted; no rash etc...  RADIOLOGY DATA:  Reviewed in the EPIC EMR & discussed w/ the patient...  LABORATORY DATA:  Reviewed in the EPIC EMR & discussed w/ the patient...   Assessment & Plan:   <Pre-op clearance for planned right TKR 2/13>  OK for surg...  <Hx PAF> on ASA81mg  daily; BP is normal & he remains asymptomatic; EKG shows NSR, WNL.[name] Kitchen.     <Chol> on Cres20 & FLP is at goals; continue same med + diet...     <HH/ Esophagitis> on Prilosec 20mg /d; he remains asymptomatic...     <Divertics, Polyps> on stool softener daily; had bouts of diverticulitis 9/12 & 1/13 as above- resolved w/ cipro/ flagyl; he sees DrKaplan w/ Colonoscopy 9/12 revealing divertics, one polyp removed= tubular adenoma & repeat planned 50yrs...     <Hx pancreatitis> no recurrence since his only bout in 2009; he knows to avoid Etoh etc...     <  BPH, elev PSA> he had f/u DrBorden 8/12- s/p TURP 2000 for BPH, elev PSAs w/ Bx's on 2 sep occas & both benign; he continues Q110mo follow up w/ Urology.     <DJD/ DDD> followed by  Hetty Blend; s/p left TKR 12/12 & he is planning right TKR 12/13...   Patient's Medications  New Prescriptions   No medications on file  Previous Medications   ASPIRIN 81 MG TABLET    Take 81 mg by mouth daily.     DOCUSATE SODIUM (COLACE) 100 MG CAPSULE    Take 100 mg by mouth 2 (two) times daily.   HYDROCODONE-ACETAMINOPHEN (NORCO) 5-325 MG PER TABLET    Take 1 tablet by mouth every 8 (eight) hours as needed. For pain   OMEPRAZOLE (PRILOSEC) 20 MG CAPSULE    Take 1 capsule (20 mg total) by mouth daily.  Modified Medications   Modified Medication Previous Medication   ROSUVASTATIN (CRESTOR) 20 MG TABLET rosuvastatin (CRESTOR) 20 MG tablet      Take 20 mg by mouth at bedtime.    Take 1 tablet (20 mg total) by mouth at bedtime. Appointment needed for further refills.  Discontinued Medications   No medications on file

## 2011-11-21 ENCOUNTER — Telehealth: Payer: Self-pay | Admitting: Pulmonary Disease

## 2011-11-21 NOTE — Telephone Encounter (Signed)
Called and spoke with pt and he is aware of results per SN and nothing further is needed.

## 2011-11-21 NOTE — Telephone Encounter (Signed)
Leigh did you call pt?

## 2011-12-10 DIAGNOSIS — M1710 Unilateral primary osteoarthritis, unspecified knee: Secondary | ICD-10-CM | POA: Diagnosis not present

## 2011-12-13 DIAGNOSIS — R972 Elevated prostate specific antigen [PSA]: Secondary | ICD-10-CM | POA: Diagnosis not present

## 2011-12-16 ENCOUNTER — Encounter (HOSPITAL_COMMUNITY): Payer: Self-pay | Admitting: Pharmacy Technician

## 2011-12-19 ENCOUNTER — Other Ambulatory Visit: Payer: Self-pay | Admitting: Physician Assistant

## 2011-12-20 ENCOUNTER — Encounter (HOSPITAL_COMMUNITY)
Admission: RE | Admit: 2011-12-20 | Discharge: 2011-12-20 | Disposition: A | Discharge status: Home / Self Care | Payer: Medicare Other | Source: Ambulatory Visit | Attending: Orthopedic Surgery | Admitting: Orthopedic Surgery

## 2011-12-20 ENCOUNTER — Encounter (HOSPITAL_COMMUNITY): Payer: Self-pay

## 2011-12-20 DIAGNOSIS — R972 Elevated prostate specific antigen [PSA]: Secondary | ICD-10-CM | POA: Diagnosis not present

## 2011-12-20 DIAGNOSIS — N400 Enlarged prostate without lower urinary tract symptoms: Secondary | ICD-10-CM | POA: Diagnosis not present

## 2011-12-20 DIAGNOSIS — R310 Gross hematuria: Secondary | ICD-10-CM | POA: Diagnosis not present

## 2011-12-20 LAB — URINALYSIS, ROUTINE W REFLEX MICROSCOPIC
Bilirubin Urine: NEGATIVE
Ketones, ur: NEGATIVE mg/dL
Nitrite: NEGATIVE
Protein, ur: NEGATIVE mg/dL
Urobilinogen, UA: 0.2 mg/dL (ref 0.0–1.0)

## 2011-12-20 LAB — CBC WITH DIFFERENTIAL/PLATELET
Basophils Relative: 1 % (ref 0–1)
Eosinophils Absolute: 0.2 10*3/uL (ref 0.0–0.7)
Eosinophils Relative: 2 % (ref 0–5)
Lymphs Abs: 2.3 10*3/uL (ref 0.7–4.0)
MCH: 30.2 pg (ref 26.0–34.0)
MCHC: 34.4 g/dL (ref 30.0–36.0)
MCV: 87.8 fL (ref 78.0–100.0)
Monocytes Relative: 10 % (ref 3–12)
Neutrophils Relative %: 61 % (ref 43–77)
Platelets: 199 10*3/uL (ref 150–400)
RBC: 5.16 MIL/uL (ref 4.22–5.81)

## 2011-12-20 LAB — COMPREHENSIVE METABOLIC PANEL
Albumin: 4.1 g/dL (ref 3.5–5.2)
BUN: 16 mg/dL (ref 6–23)
Calcium: 9.5 mg/dL (ref 8.4–10.5)
GFR calc Af Amer: >90 mL/min (ref >90)
Glucose, Bld: 109 mg/dL — ABNORMAL HIGH (ref 70–99)
Potassium: 4.3 mEq/L (ref 3.5–5.1)
Sodium: 141 mEq/L (ref 135–145)
Total Protein: 7.8 g/dL (ref 6.0–8.3)

## 2011-12-20 LAB — TYPE AND SCREEN
ABO/RH(D): O POS
Antibody Screen: NEGATIVE

## 2011-12-20 LAB — SURGICAL PCR SCREEN
MRSA, PCR: NEGATIVE
Staphylococcus aureus: NEGATIVE

## 2011-12-20 LAB — PROTIME-INR
INR: 0.95 (ref 0.00–1.49)
Prothrombin Time: 12.6 seconds (ref 11.6–15.2)

## 2011-12-20 LAB — URINE MICROSCOPIC-ADD ON

## 2011-12-20 NOTE — Pre-Procedure Instructions (Signed)
Thomas Pearson  12/20/2011   Your procedure is scheduled on:  Friday, December Thomas  Report to [facility] on the 3 rd floor  at 0530 AM.  Call this number if you have problems the morning of surgery: [phone]   Remember:   Do not eat food or drink liquids:After Midnight. Thursday night     Take these medicines the morning of surgery with A SIP OF WATER: Omeprazole   Do not wear jewelry, make-up or nail polish.  Do not wear lotions, powders, or perfumes. You may wear deodorant.  Do not shave 48 hours prior to surgery. Men may shave face and neck.  Do not bring valuables to the hospital.  Contacts, dentures or bridgework may not be worn into surgery.  Leave suitcase in the car. After surgery it may be brought to your room.  For patients admitted to the hospital, checkout time is 11:00 AM the day of discharge.   Special Instructions: Incentive Spirometry - Practice and bring it with you on the day of surgery. Shower using CHG 2 nights before surgery and the night before surgery.  If you shower the day of surgery use CHG.  Use special wash - you have one bottle of CHG for all showers.  You should use approximately 1/3 of the bottle for each shower.   Please read over the following fact sheets that you were given: Pain Booklet, Coughing and Deep Breathing, Blood Transfusion Information, Total Joint Packet, MRSA Information and Surgical Site Infection Prevention

## 2011-12-21 LAB — URINE CULTURE: Culture: NO GROWTH

## 2011-12-26 MED ORDER — CEFAZOLIN SODIUM-DEXTROSE 2-3 GM-% IV SOLR
2.0000 g | INTRAVENOUS | Status: AC
  Administered 2011-12-27: 2 g via INTRAVENOUS
  Filled 2011-12-26: qty 50

## 2011-12-26 NOTE — H&P (Signed)
TOTAL KNEE ADMISSION H&P  Patient is being admitted for right total knee arthroplasty.  Subjective:  Chief Complaint:right knee pain.  HPI: Thomas Pearson, 71 y.o. male, has a history of pain and functional disability in the right knee due to arthritis and has failed non-surgical conservative treatments for greater than 12 weeks to includeNSAID's and/or analgesics, corticosteriod injections, viscosupplementation injections, use of assistive devices and activity modification.  Onset of symptoms was gradual, starting several years ago with gradually worsening course since that time. The patient noted no past surgery on the right knee(s).  Patient currently rates pain in the right knee(s) as moderate to severe with activity. Patient has night pain, worsening of pain with activity and weight bearing, pain that interferes with activities of daily living, crepitus and joint swelling.  Patient has evidence of periarticular osteophytes and joint space narrowing by imaging studies. Has history of Left TKA in 2012 doint well. There is no active infection.  Patient Active Problem List   Diagnosis Date Noted  . Right knee pain 11/20/2011  . PAF (paroxysmal atrial fibrillation) 02/07/2011  . Diarrhea 01/24/2011  . S/P knee replacement 01/22/2011  . Diverticulitis of colon (without mention of hemorrhage) 09/18/2010  . Personal history of colonic polyps 09/18/2010  . ACUTE PANCREATITIS 06/29/2009  . DEGENERATIVE DISC DISEASE, CERVICAL SPINE 06/29/2009  . ELEVATED PROSTATE SPECIFIC ANTIGEN 06/29/2009  . ABNORMAL ELECTROCARDIOGRAM 06/28/2009  . DIZZINESS 02/10/2007  . COLONIC POLYPS 11/24/2006  . HYPERCHOLESTEROLEMIA 11/24/2006  . ALLERGIC RHINITIS 11/24/2006  . HIATAL HERNIA 11/24/2006  . DIVERTICULOSIS, COLON 11/24/2006  . DEGENERATIVE JOINT DISEASE 11/24/2006  . BENIGN PROSTATIC HYPERTROPHY, HX OF 11/24/2006  . ESOPHAGITIS, REFLUX 04/23/2000   Past Medical History  Diagnosis Date  .  Hyperlipidemia   . Pancreatitis   . Diverticulitis   . Allergy     hay fever  . GERD (gastroesophageal reflux disease)   . BPH (benign prostatic hyperplasia)   . Hiatal hernia     NO SURGERY FOR THIS  . Arthritis     osteoarthritis    Past Surgical History  Procedure Date  . Prostate surgery   . 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 Joint replacement   . Transurethral resection of prostate      (Not in a hospital admission) Allergies  Allergen Reactions  . Oxycodone-Acetaminophen Hives    History  Substance Use Topics  . Smoking status: Former Smoker    Types: Cigarettes    Quit date: 01/07/1965  . Smokeless tobacco: Former Neurosurgeon    Quit date: 12/16/1965  . Alcohol Use: No    Family History  Problem Relation Age of Onset  . Cancer Brother     prostate  . Colon cancer Neg Hx   . Heart disease Father      Review of Systems  Constitutional: Negative.   HENT: Negative.   Eyes: Negative.   Respiratory: Negative.   Cardiovascular: Negative.   Gastrointestinal: Negative.   Genitourinary: Negative.   Musculoskeletal: Positive for joint pain.  Skin: Negative.   Neurological: Negative.   Endo/Heme/Allergies: Negative.   Psychiatric/Behavioral: Negative for depression.    Objective:  Physical Exam  Constitutional: He is oriented to person, place, and time. He appears well-developed and well-nourished. No distress.  HENT:  Head: Normocephalic and atraumatic.  Nose: Nose normal.  Eyes:  Conjunctivae normal and EOM are normal. Pupils are equal, round, and reactive to light.  Neck: Normal range of motion. Neck supple.  Cardiovascular: Normal rate, regular rhythm and normal heart sounds.   No murmur heard. Respiratory: Effort normal and breath sounds normal. No respiratory distress. He has no wheezes. He exhibits  no tenderness.  GI: Soft. Bowel sounds are normal. He exhibits no distension. There is no tenderness.  Musculoskeletal:       Right knee: tenderness found. Medial joint line tenderness noted.       Positive patellofemoral crepitus, stable ligamentous testing, antalgic gait, no calf tenderness, intact dorsi and plantar flexion, varus knee  Lymphadenopathy:    He has no cervical adenopathy.  Neurological: He is alert and oriented to person, place, and time. No cranial nerve deficit.  Skin: Skin is warm and dry. No rash noted. No erythema.  Psychiatric: He has a normal mood and affect. His behavior is normal.    Vital Signs: Temp 98.4, RR18, HR 64, BP 151/90  Labs:   Estimated Body mass index is 25.23 kg/(m^2) as calculated from the following:   Height as of 11/20/11: 6\' 0" (1.829 m).   Weight as of 11/20/11: 186 lb(84.369 kg).   Imaging Review Plain radiographs demonstrate severe degenerative joint disease of the right knee(s). The overall alignment issignificant varus. The bone quality appears to be good for age and reported activity level.  Assessment/Plan:  End stage arthritis, right knee   The patient history, physical examination, clinical judgment of the provider and imaging studies are consistent with end stage degenerative joint disease of the right knee(s) and total knee arthroplasty is deemed medically necessary. The treatment options including medical management, injection therapy arthroscopy and arthroplasty were discussed at length. The risks and benefits of total knee arthroplasty were presented and reviewed. The risks due to aseptic loosening, infection, stiffness, patella tracking problems, thromboembolic complications and other imponderables were discussed. The patient acknowledged the explanation, agreed to proceed with the plan and consent was signed. Patient is being admitted for inpatient treatment for surgery, pain control, PT, OT, prophylactic antibiotics, VTE  prophylaxis, progressive ambulation and ADL's and discharge planning. The patient is planning to be discharged home with home health services

## 2011-12-27 ENCOUNTER — Encounter (HOSPITAL_COMMUNITY)
Admission: RE | Disposition: A | Discharge status: Home Health | Payer: Self-pay | Source: Ambulatory Visit | Attending: Orthopedic Surgery

## 2011-12-27 ENCOUNTER — Inpatient Hospital Stay (HOSPITAL_COMMUNITY)
Admission: RE | Admit: 2011-12-27 | Discharge: 2011-12-30 | DRG: 470 | Disposition: A | Discharge status: Home Health | Payer: Medicare Other | Source: Ambulatory Visit | Attending: Orthopedic Surgery | Admitting: Orthopedic Surgery

## 2011-12-27 ENCOUNTER — Inpatient Hospital Stay (HOSPITAL_COMMUNITY): Payer: Medicare Other | Admitting: Anesthesiology

## 2011-12-27 ENCOUNTER — Encounter (HOSPITAL_COMMUNITY): Payer: Self-pay | Admitting: Anesthesiology

## 2011-12-27 ENCOUNTER — Encounter (HOSPITAL_COMMUNITY): Payer: Self-pay | Admitting: Surgery

## 2011-12-27 DIAGNOSIS — G8918 Other acute postprocedural pain: Secondary | ICD-10-CM | POA: Diagnosis not present

## 2011-12-27 DIAGNOSIS — Z87891 Personal history of nicotine dependence: Secondary | ICD-10-CM | POA: Diagnosis not present

## 2011-12-27 DIAGNOSIS — I4891 Unspecified atrial fibrillation: Secondary | ICD-10-CM | POA: Diagnosis present

## 2011-12-27 DIAGNOSIS — M1710 Unilateral primary osteoarthritis, unspecified knee: Secondary | ICD-10-CM | POA: Diagnosis not present

## 2011-12-27 DIAGNOSIS — N400 Enlarged prostate without lower urinary tract symptoms: Secondary | ICD-10-CM | POA: Diagnosis present

## 2011-12-27 DIAGNOSIS — E7800 Pure hypercholesterolemia, unspecified: Secondary | ICD-10-CM | POA: Diagnosis present

## 2011-12-27 DIAGNOSIS — M25561 Pain in right knee: Secondary | ICD-10-CM | POA: Diagnosis present

## 2011-12-27 DIAGNOSIS — M1990 Unspecified osteoarthritis, unspecified site: Secondary | ICD-10-CM | POA: Diagnosis present

## 2011-12-27 DIAGNOSIS — J309 Allergic rhinitis, unspecified: Secondary | ICD-10-CM | POA: Diagnosis present

## 2011-12-27 DIAGNOSIS — D126 Benign neoplasm of colon, unspecified: Secondary | ICD-10-CM | POA: Diagnosis not present

## 2011-12-27 DIAGNOSIS — Z8719 Personal history of other diseases of the digestive system: Secondary | ICD-10-CM

## 2011-12-27 DIAGNOSIS — K449 Diaphragmatic hernia without obstruction or gangrene: Secondary | ICD-10-CM | POA: Diagnosis present

## 2011-12-27 DIAGNOSIS — Z96659 Presence of unspecified artificial knee joint: Secondary | ICD-10-CM

## 2011-12-27 DIAGNOSIS — K219 Gastro-esophageal reflux disease without esophagitis: Secondary | ICD-10-CM | POA: Diagnosis present

## 2011-12-27 DIAGNOSIS — Z79899 Other long term (current) drug therapy: Secondary | ICD-10-CM | POA: Diagnosis not present

## 2011-12-27 DIAGNOSIS — Z860100 Personal history of colon polyps, unspecified: Secondary | ICD-10-CM

## 2011-12-27 DIAGNOSIS — E785 Hyperlipidemia, unspecified: Secondary | ICD-10-CM | POA: Diagnosis present

## 2011-12-27 DIAGNOSIS — M25569 Pain in unspecified knee: Secondary | ICD-10-CM | POA: Diagnosis not present

## 2011-12-27 DIAGNOSIS — Z885 Allergy status to narcotic agent status: Secondary | ICD-10-CM | POA: Diagnosis not present

## 2011-12-27 DIAGNOSIS — M5030 Other cervical disc degeneration, unspecified cervical region: Secondary | ICD-10-CM | POA: Diagnosis not present

## 2011-12-27 DIAGNOSIS — Z87898 Personal history of other specified conditions: Secondary | ICD-10-CM | POA: Diagnosis present

## 2011-12-27 DIAGNOSIS — IMO0002 Reserved for concepts with insufficient information to code with codable children: Secondary | ICD-10-CM | POA: Diagnosis not present

## 2011-12-27 HISTORY — PX: TOTAL KNEE ARTHROPLASTY: SHX125

## 2011-12-27 SURGERY — ARTHROPLASTY, KNEE, TOTAL
Anesthesia: General | Laterality: Right

## 2011-12-27 SURGERY — ARTHROPLASTY, KNEE, TOTAL
Anesthesia: General | Site: Knee | Laterality: Right | Wound class: Clean

## 2011-12-27 MED ORDER — ENOXAPARIN SODIUM 30 MG/0.3ML ~~LOC~~ SOLN: 30.0000 mg | Freq: Two times a day (BID) | SUBCUTANEOUS | Status: DC

## 2011-12-27 MED ORDER — LACTATED RINGERS IV SOLN
INTRAVENOUS | Status: DC | PRN
  Administered 2011-12-27 (×2): via INTRAVENOUS

## 2011-12-27 MED ORDER — PROPOFOL 10 MG/ML IV BOLUS
INTRAVENOUS | Status: DC | PRN
  Administered 2011-12-27: 180 mg via INTRAVENOUS

## 2011-12-27 MED ORDER — PHENOL 1.4 % MT LIQD: 1.0000 | OROMUCOSAL | Status: DC | PRN

## 2011-12-27 MED ORDER — METHOCARBAMOL 500 MG PO TABS: 500.0000 mg | ORAL_TABLET | Freq: Four times a day (QID) | ORAL | Status: DC | PRN

## 2011-12-27 MED ORDER — BISACODYL 10 MG RE SUPP: 10.0000 mg | Freq: Every day | RECTAL | Status: DC | PRN

## 2011-12-27 MED ORDER — ACETAMINOPHEN 10 MG/ML IV SOLN
1000.0000 mg | INTRAVENOUS | Status: DC
  Filled 2011-12-27: qty 100

## 2011-12-27 MED ORDER — FENTANYL CITRATE 0.05 MG/ML IJ SOLN
INTRAMUSCULAR | Status: DC | PRN
  Administered 2011-12-27 (×3): 50 ug via INTRAVENOUS
  Administered 2011-12-27: 100 ug via INTRAVENOUS

## 2011-12-27 MED ORDER — ACETAMINOPHEN 325 MG PO TABS: 650.0000 mg | ORAL_TABLET | Freq: Four times a day (QID) | ORAL | Status: DC | PRN

## 2011-12-27 MED ORDER — GLYCOPYRROLATE 0.2 MG/ML IJ SOLN
INTRAMUSCULAR | Status: DC | PRN
  Administered 2011-12-27: 0.4 mg via INTRAVENOUS

## 2011-12-27 MED ORDER — ACETAMINOPHEN 650 MG RE SUPP: 650.0000 mg | Freq: Four times a day (QID) | RECTAL | Status: DC | PRN

## 2011-12-27 MED ORDER — ONDANSETRON HCL 4 MG/2ML IJ SOLN: 4.0000 mg | Freq: Four times a day (QID) | INTRAMUSCULAR | Status: DC | PRN

## 2011-12-27 MED ORDER — ARTIFICIAL TEARS OP OINT
TOPICAL_OINTMENT | OPHTHALMIC | Status: DC | PRN
  Administered 2011-12-27: 1 via OPHTHALMIC

## 2011-12-27 MED ORDER — ONDANSETRON HCL 4 MG/2ML IJ SOLN: 4.0000 mg | Freq: Once | INTRAMUSCULAR | Status: DC | PRN

## 2011-12-27 MED ORDER — ONDANSETRON HCL 4 MG/2ML IJ SOLN
INTRAMUSCULAR | Status: DC | PRN
  Administered 2011-12-27: 4 mg via INTRAVENOUS

## 2011-12-27 MED ORDER — ENOXAPARIN SODIUM 30 MG/0.3ML ~~LOC~~ SOLN
30.0000 mg | Freq: Two times a day (BID) | SUBCUTANEOUS | Status: DC
Start: 2011-12-28 — End: 2011-12-30
  Administered 2011-12-28 – 2011-12-30 (×5): 30 mg via SUBCUTANEOUS
  Filled 2011-12-27 (×7): qty 0.3

## 2011-12-27 MED ORDER — ACETAMINOPHEN 10 MG/ML IV SOLN: 1000.0000 mg | Freq: Once | INTRAVENOUS | Status: DC | PRN

## 2011-12-27 MED ORDER — CHLORHEXIDINE GLUCONATE 4 % EX LIQD: 60.0000 mL | Freq: Once | CUTANEOUS | Status: DC

## 2011-12-27 MED ORDER — METHOCARBAMOL 500 MG PO TABS
500.0000 mg | ORAL_TABLET | Freq: Four times a day (QID) | ORAL | Status: DC | PRN
  Administered 2011-12-27 – 2011-12-30 (×4): 500 mg via ORAL
  Filled 2011-12-27 (×4): qty 1

## 2011-12-27 MED ORDER — HYDROMORPHONE HCL PF 1 MG/ML IJ SOLN
0.2500 mg | INTRAMUSCULAR | Status: DC | PRN
  Administered 2011-12-27 (×2): 0.5 mg via INTRAVENOUS

## 2011-12-27 MED ORDER — HYDROMORPHONE HCL PF 1 MG/ML IJ SOLN: 1.0000 mg | INTRAMUSCULAR | Status: DC | PRN

## 2011-12-27 MED ORDER — EPHEDRINE SULFATE 50 MG/ML IJ SOLN
INTRAMUSCULAR | Status: DC | PRN
  Administered 2011-12-27 (×4): 5 mg via INTRAVENOUS

## 2011-12-27 MED ORDER — ACETAMINOPHEN 10 MG/ML IV SOLN
INTRAVENOUS | Status: AC
  Filled 2011-12-27: qty 100

## 2011-12-27 MED ORDER — METOCLOPRAMIDE HCL 10 MG PO TABS: 5.0000 mg | ORAL_TABLET | Freq: Three times a day (TID) | ORAL | Status: DC | PRN

## 2011-12-27 MED ORDER — LIDOCAINE HCL (CARDIAC) 20 MG/ML IV SOLN
INTRAVENOUS | Status: DC | PRN
  Administered 2011-12-27: 40 mg via INTRAVENOUS

## 2011-12-27 MED ORDER — SODIUM CHLORIDE 0.9 % IV SOLN: INTRAVENOUS | Status: DC

## 2011-12-27 MED ORDER — DEXTROSE 5 % IV SOLN
INTRAVENOUS | Status: DC | PRN
  Administered 2011-12-27: 08:00:00 via INTRAVENOUS

## 2011-12-27 MED ORDER — DOCUSATE SODIUM 100 MG PO CAPS
100.0000 mg | ORAL_CAPSULE | Freq: Two times a day (BID) | ORAL | Status: DC
  Administered 2011-12-27 – 2011-12-30 (×7): 100 mg via ORAL
  Filled 2011-12-27 (×7): qty 1

## 2011-12-27 MED ORDER — ONDANSETRON HCL 4 MG PO TABS: 4.0000 mg | ORAL_TABLET | Freq: Four times a day (QID) | ORAL | Status: DC | PRN

## 2011-12-27 MED ORDER — ATORVASTATIN CALCIUM 40 MG PO TABS
40.0000 mg | ORAL_TABLET | Freq: Every day | ORAL | Status: DC
  Administered 2011-12-27 – 2011-12-29 (×3): 40 mg via ORAL
  Filled 2011-12-27 (×4): qty 1

## 2011-12-27 MED ORDER — NEOSTIGMINE METHYLSULFATE 1 MG/ML IJ SOLN
INTRAMUSCULAR | Status: DC | PRN
  Administered 2011-12-27: 3 mg via INTRAVENOUS

## 2011-12-27 MED ORDER — SODIUM CHLORIDE 0.9 % IR SOLN
Status: DC | PRN
  Administered 2011-12-27: 3000 mL

## 2011-12-27 MED ORDER — SODIUM CHLORIDE 0.9 % IV SOLN
INTRAVENOUS | Status: DC
  Administered 2011-12-27: via INTRAVENOUS

## 2011-12-27 MED ORDER — SENNOSIDES-DOCUSATE SODIUM 8.6-50 MG PO TABS
1.0000 | ORAL_TABLET | Freq: Every evening | ORAL | Status: DC | PRN
  Administered 2011-12-29: 1 via ORAL

## 2011-12-27 MED ORDER — ACETAMINOPHEN 10 MG/ML IV SOLN
1000.0000 mg | Freq: Four times a day (QID) | INTRAVENOUS | Status: DC
  Administered 2011-12-27: 1000 mg via INTRAVENOUS
  Filled 2011-12-27 (×3): qty 100

## 2011-12-27 MED ORDER — HYDROCODONE-ACETAMINOPHEN 7.5-325 MG PO TABS
1.0000 | ORAL_TABLET | ORAL | Status: DC | PRN
  Administered 2011-12-27 – 2011-12-30 (×10): 2 via ORAL
  Filled 2011-12-27 (×10): qty 2

## 2011-12-27 MED ORDER — METOCLOPRAMIDE HCL 5 MG/ML IJ SOLN: 5.0000 mg | Freq: Three times a day (TID) | INTRAMUSCULAR | Status: DC | PRN

## 2011-12-27 MED ORDER — HYDROCODONE-ACETAMINOPHEN 7.5-325 MG PO TABS: ORAL_TABLET | ORAL | Status: DC

## 2011-12-27 MED ORDER — CEFAZOLIN SODIUM 1-5 GM-% IV SOLN
1.0000 g | Freq: Four times a day (QID) | INTRAVENOUS | Status: AC
  Administered 2011-12-27 (×2): 1 g via INTRAVENOUS
  Filled 2011-12-27 (×3): qty 50

## 2011-12-27 MED ORDER — ROCURONIUM BROMIDE 100 MG/10ML IV SOLN
INTRAVENOUS | Status: DC | PRN
Start: 2011-12-27 — End: 2011-12-27
  Administered 2011-12-27: 50 mg via INTRAVENOUS

## 2011-12-27 MED ORDER — METHOCARBAMOL 100 MG/ML IJ SOLN
500.0000 mg | Freq: Four times a day (QID) | INTRAVENOUS | Status: DC | PRN
  Filled 2011-12-27: qty 5

## 2011-12-27 MED ORDER — MENTHOL 3 MG MT LOZG
1.0000 | LOZENGE | OROMUCOSAL | Status: DC | PRN
  Filled 2011-12-27: qty 9

## 2011-12-27 MED ORDER — FLEET ENEMA 7-19 GM/118ML RE ENEM: 1.0000 | ENEMA | Freq: Once | RECTAL | Status: AC | PRN

## 2011-12-27 MED ORDER — PANTOPRAZOLE SODIUM 40 MG PO TBEC
40.0000 mg | DELAYED_RELEASE_TABLET | Freq: Every day | ORAL | Status: DC
  Administered 2011-12-28 – 2011-12-30 (×3): 40 mg via ORAL
  Filled 2011-12-27: qty 1

## 2011-12-27 MED ORDER — MIDAZOLAM HCL 5 MG/5ML IJ SOLN
INTRAMUSCULAR | Status: DC | PRN
  Administered 2011-12-27: 1 mg via INTRAVENOUS

## 2011-12-27 MED ORDER — HYDROMORPHONE HCL PF 1 MG/ML IJ SOLN
INTRAMUSCULAR | Status: AC
  Filled 2011-12-27: qty 1

## 2011-12-27 SURGICAL SUPPLY — 60 items
BANDAGE ELASTIC 4 VELCRO ST LF (GAUZE/BANDAGES/DRESSINGS) ×2 IMPLANT
BANDAGE ELASTIC 6 VELCRO ST LF (GAUZE/BANDAGES/DRESSINGS) ×2 IMPLANT
BANDAGE ESMARK 6X9 LF (GAUZE/BANDAGES/DRESSINGS) ×1 IMPLANT
BLADE SAGITTAL 25.0X1.19X90 (BLADE) ×2 IMPLANT
BLADE SAW SAG 90X13X1.27 (BLADE) ×2 IMPLANT
BNDG CMPR 9X6 STRL LF SNTH (GAUZE/BANDAGES/DRESSINGS) ×1
BNDG ESMARK 6X9 LF (GAUZE/BANDAGES/DRESSINGS) ×2
BOWL SMART MIX CTS (DISPOSABLE) ×2 IMPLANT
CEMENT HV SMART SET (Cement) ×2 IMPLANT
CLOTH BEACON ORANGE TIMEOUT ST (SAFETY) ×2 IMPLANT
COVER BACK TABLE 24X17X13 BIG (DRAPES) IMPLANT
COVER SURGICAL LIGHT HANDLE (MISCELLANEOUS) ×2 IMPLANT
CUFF TOURNIQUET SINGLE 34IN LL (TOURNIQUET CUFF) ×2 IMPLANT
CUFF TOURNIQUET SINGLE 44IN (TOURNIQUET CUFF) IMPLANT
DRAPE INCISE IOBAN 66X45 STRL (DRAPES) IMPLANT
DRAPE ORTHO SPLIT 77X108 STRL (DRAPES) ×4
DRAPE SURG ORHT 6 SPLT 77X108 (DRAPES) ×2 IMPLANT
DRAPE U-SHAPE 47X51 STRL (DRAPES) ×2 IMPLANT
DRSG ADAPTIC 3X8 NADH LF (GAUZE/BANDAGES/DRESSINGS) ×2 IMPLANT
DRSG PAD ABDOMINAL 8X10 ST (GAUZE/BANDAGES/DRESSINGS) ×3 IMPLANT
DURAPREP 26ML APPLICATOR (WOUND CARE) ×2 IMPLANT
ELECT REM PT RETURN 9FT ADLT (ELECTROSURGICAL) ×2
ELECTRODE REM PT RTRN 9FT ADLT (ELECTROSURGICAL) ×1 IMPLANT
EVACUATOR 1/8 PVC DRAIN (DRAIN) ×2 IMPLANT
FACESHIELD LNG OPTICON STERILE (SAFETY) ×4 IMPLANT
FLOSEAL 10ML (HEMOSTASIS) IMPLANT
GLOVE BIOGEL PI IND STRL 8 (GLOVE) ×4 IMPLANT
GLOVE BIOGEL PI INDICATOR 8 (GLOVE) ×4
GLOVE ORTHO TXT STRL SZ7.5 (GLOVE) ×6 IMPLANT
GLOVE SURG ORTHO 8.0 STRL STRW (GLOVE) ×6 IMPLANT
GOWN PREVENTION PLUS XLARGE (GOWN DISPOSABLE) ×2 IMPLANT
GOWN PREVENTION PLUS XXLARGE (GOWN DISPOSABLE) ×2 IMPLANT
GOWN STRL NON-REIN LRG LVL3 (GOWN DISPOSABLE) ×4 IMPLANT
HANDPIECE INTERPULSE COAX TIP (DISPOSABLE) ×2
HOOD PEEL AWAY FACE SHEILD DIS (HOOD) ×2 IMPLANT
IMMOBILIZER KNEE 22 UNIV (SOFTGOODS) IMPLANT
KIT BASIN OR (CUSTOM PROCEDURE TRAY) ×2 IMPLANT
KIT ROOM TURNOVER OR (KITS) ×2 IMPLANT
MANIFOLD NEPTUNE II (INSTRUMENTS) ×2 IMPLANT
NEEDLE 22X1 1/2 (OR ONLY) (NEEDLE) IMPLANT
NS IRRIG 1000ML POUR BTL (IV SOLUTION) ×2 IMPLANT
PACK TOTAL JOINT (CUSTOM PROCEDURE TRAY) ×2 IMPLANT
PAD ARMBOARD 7.5X6 YLW CONV (MISCELLANEOUS) ×4 IMPLANT
PAD CAST 4YDX4 CTTN HI CHSV (CAST SUPPLIES) ×1 IMPLANT
PADDING CAST COTTON 4X4 STRL (CAST SUPPLIES) ×2
PADDING CAST COTTON 6X4 STRL (CAST SUPPLIES) ×2 IMPLANT
SET HNDPC FAN SPRY TIP SCT (DISPOSABLE) ×1 IMPLANT
SPONGE GAUZE 4X4 12PLY (GAUZE/BANDAGES/DRESSINGS) ×2 IMPLANT
STAPLER VISISTAT 35W (STAPLE) ×2 IMPLANT
SUCTION FRAZIER TIP 10 FR DISP (SUCTIONS) ×2 IMPLANT
SUT ETHIBOND NAB CT1 #1 30IN (SUTURE) ×6 IMPLANT
SUT VIC AB 0 CT1 27 (SUTURE) ×2
SUT VIC AB 0 CT1 27XBRD ANBCTR (SUTURE) ×1 IMPLANT
SUT VIC AB 2-0 CT1 27 (SUTURE) ×4
SUT VIC AB 2-0 CT1 TAPERPNT 27 (SUTURE) ×2 IMPLANT
SYR CONTROL 10ML LL (SYRINGE) IMPLANT
TOWEL OR 17X24 6PK STRL BLUE (TOWEL DISPOSABLE) ×2 IMPLANT
TOWEL OR 17X26 10 PK STRL BLUE (TOWEL DISPOSABLE) ×2 IMPLANT
TRAY FOLEY CATH 14FR (SET/KITS/TRAYS/PACK) ×2 IMPLANT
WATER STERILE IRR 1000ML POUR (IV SOLUTION) ×6 IMPLANT

## 2011-12-27 NOTE — H&P (View-Only) (Signed)
TOTAL KNEE ADMISSION H&P  Patient is being admitted for right total knee arthroplasty.  Subjective:  Chief Complaint:right knee pain.  HPI: Thomas Pearson, 71 y.o. male, has a history of pain and functional disability in the right knee due to arthritis and has failed non-surgical conservative treatments for greater than 12 weeks to includeNSAID's and/or analgesics, corticosteriod injections, viscosupplementation injections, use of assistive devices and activity modification.  Onset of symptoms was gradual, starting several years ago with gradually worsening course since that time. The patient noted no past surgery on the right knee(s).  Patient currently rates pain in the right knee(s) as moderate to severe with activity. Patient has night pain, worsening of pain with activity and weight bearing, pain that interferes with activities of daily living, crepitus and joint swelling.  Patient has evidence of periarticular osteophytes and joint space narrowing by imaging studies. Has history of Left TKA in 2012 doint well. There is no active infection.  Patient Active Problem List   Diagnosis Date Noted  . Right knee pain 11/20/2011  . PAF (paroxysmal atrial fibrillation) 02/07/2011  . Diarrhea 01/24/2011  . S/P knee replacement 01/22/2011  . Diverticulitis of colon (without mention of hemorrhage) 09/18/2010  . Personal history of colonic polyps 09/18/2010  . ACUTE PANCREATITIS 06/29/2009  . DEGENERATIVE DISC DISEASE, CERVICAL SPINE 06/29/2009  . ELEVATED PROSTATE SPECIFIC ANTIGEN 06/29/2009  . ABNORMAL ELECTROCARDIOGRAM 06/28/2009  . DIZZINESS 02/10/2007  . COLONIC POLYPS 11/24/2006  . HYPERCHOLESTEROLEMIA 11/24/2006  . ALLERGIC RHINITIS 11/24/2006  . HIATAL HERNIA 11/24/2006  . DIVERTICULOSIS, COLON 11/24/2006  . DEGENERATIVE JOINT DISEASE 11/24/2006  . BENIGN PROSTATIC HYPERTROPHY, HX OF 11/24/2006  . ESOPHAGITIS, REFLUX 04/23/2000   Past Medical History  Diagnosis Date  .  Hyperlipidemia   . Pancreatitis   . Diverticulitis   . Allergy     hay fever  . GERD (gastroesophageal reflux disease)   . BPH (benign prostatic hyperplasia)   . Hiatal hernia     NO SURGERY FOR THIS  . Arthritis     osteoarthritis    Past Surgical History  Procedure Date  . Prostate surgery   . Total knee arthroplasty 12/28/2010    Procedure: TOTAL KNEE ARTHROPLASTY;  Surgeon: W D Caffrey Jr., MD;  Location: [facility] OR;  Service: Orthopedics;  Laterality: Left;  left total knee - 27447  . Joint replacement   . Transurethral resection of prostate      (Not in a hospital admission) Allergies  Allergen Reactions  . Oxycodone-Acetaminophen Hives    History  Substance Use Topics  . Smoking status: Former Smoker    Types: Cigarettes    Quit date: 01/07/1965  . Smokeless tobacco: Former User    Quit date: 12/16/1965  . Alcohol Use: No    Family History  Problem Relation Age of Onset  . Cancer Brother     prostate  . Colon cancer Neg Hx   . Heart disease Father      Review of Systems  Constitutional: Negative.   HENT: Negative.   Eyes: Negative.   Respiratory: Negative.   Cardiovascular: Negative.   Gastrointestinal: Negative.   Genitourinary: Negative.   Musculoskeletal: Positive for joint pain.  Skin: Negative.   Neurological: Negative.   Endo/Heme/Allergies: Negative.   Psychiatric/Behavioral: Negative for depression.    Objective:  Physical Exam  Constitutional: He is oriented to person, place, and time. He appears well-developed and well-nourished. No distress.  HENT:  Head: Normocephalic and atraumatic.  Nose: Nose normal.  Eyes:   Conjunctivae normal and EOM are normal. Pupils are equal, round, and reactive to light.  Neck: Normal range of motion. Neck supple.  Cardiovascular: Normal rate, regular rhythm and normal heart sounds.   No murmur heard. Respiratory: Effort normal and breath sounds normal. No respiratory distress. He has no wheezes. He exhibits  no tenderness.  GI: Soft. Bowel sounds are normal. He exhibits no distension. There is no tenderness.  Musculoskeletal:       Right knee: tenderness found. Medial joint line tenderness noted.       Positive patellofemoral crepitus, stable ligamentous testing, antalgic gait, no calf tenderness, intact dorsi and plantar flexion, varus knee  Lymphadenopathy:    He has no cervical adenopathy.  Neurological: He is alert and oriented to person, place, and time. No cranial nerve deficit.  Skin: Skin is warm and dry. No rash noted. No erythema.  Psychiatric: He has a normal mood and affect. His behavior is normal.    Vital Signs: Temp 98.4, RR18, HR 64, BP 151/90  Labs:   Estimated Body mass index is 25.23 kg/(m^2) as calculated from the following:   Height as of 11/20/11: 6' 0"(1.829 m).   Weight as of 11/20/11: 186 lb(84.369 kg).   Imaging Review Plain radiographs demonstrate severe degenerative joint disease of the right knee(s). The overall alignment issignificant varus. The bone quality appears to be good for age and reported activity level.  Assessment/Plan:  End stage arthritis, right knee   The patient history, physical examination, clinical judgment of the provider and imaging studies are consistent with end stage degenerative joint disease of the right knee(s) and total knee arthroplasty is deemed medically necessary. The treatment options including medical management, injection therapy arthroscopy and arthroplasty were discussed at length. The risks and benefits of total knee arthroplasty were presented and reviewed. The risks due to aseptic loosening, infection, stiffness, patella tracking problems, thromboembolic complications and other imponderables were discussed. The patient acknowledged the explanation, agreed to proceed with the plan and consent was signed. Patient is being admitted for inpatient treatment for surgery, pain control, PT, OT, prophylactic antibiotics, VTE  prophylaxis, progressive ambulation and ADL's and discharge planning. The patient is planning to be discharged home with home health services   

## 2011-12-27 NOTE — Brief Op Note (Signed)
12/27/2011  9:48 AM  PATIENT:  Thomas Pearson  71 y.o. male  PRE-OPERATIVE DIAGNOSIS:  osteoarthritis right knee  POST-OPERATIVE DIAGNOSIS:  osteoarthritis right knee  PROCEDURE:  Procedure(s) (LRB) with comments: TOTAL KNEE ARTHROPLASTY (Right) - Right Knee Arthroplasty/Knee Medial and Lateral Compartments with Patella Resurfacing  SURGEON:  Surgeon(s) and Role:    * W D Carloyn Manner., MD - Primary  PHYSICIAN ASSISTANT:   ASSISTANTS: Margart Sickles, PA-C   ANESTHESIA:   general and femoral block  EBL:  Total I/O In: 1400 [I.V.:1400] Out: 500 [Urine:400; Blood:100]  BLOOD ADMINISTERED:none  DRAINS: Hemovac self suction right knee  LOCAL MEDICATIONS USED:  NONE  SPECIMEN:  No Specimen  DISPOSITION OF SPECIMEN:  N/A  COUNTS:  YES  TOURNIQUET:   Total Tourniquet Time Documented: Thigh (Right) - 65 minutes  DICTATION: .Other Dictation: Dictation Number   PLAN OF CARE: Admit to inpatient   PATIENT DISPOSITION:  PACU - hemodynamically stable.   Delay start of Pharmacological VTE agent (>24hrs) due to surgical blood loss or risk of bleeding: yes

## 2011-12-27 NOTE — Anesthesia Postprocedure Evaluation (Signed)
  Anesthesia Post-op Note  Patient: Marianna Payment  Procedure(s) Performed: Procedure(s) (LRB) with comments: TOTAL KNEE ARTHROPLASTY (Right) - Right Knee Arthroplasty/Knee Medial and Lateral Compartments with Patella Resurfacing  Patient Location: PACU  Anesthesia Type:General and GA combined with regional for post-op pain  Level of Consciousness: awake, alert  and oriented  Airway and Oxygen Therapy: Patient Spontanous Breathing and Patient connected to nasal cannula oxygen  Post-op Pain: mild  Post-op Assessment: Post-op Vital signs reviewed, Patient's Cardiovascular Status Stable, Respiratory Function Stable, Patent Airway, No signs of Nausea or vomiting and Pain level controlled  Post-op Vital Signs: stable  Complications: No apparent anesthesia complications

## 2011-12-27 NOTE — Progress Notes (Signed)
Attempted to meet with pt and complete SBIRT screening, however, pt requested that CSW visit later when visitor was gone.

## 2011-12-27 NOTE — Op Note (Signed)
NAME:  Thomas Pearson, Thomas Pearson NO.:  [id number]  MEDICAL RECORD NO.:  [id number]  LOCATION:  [id number]                        FACILITY:  [facility]  PHYSICIAN:  Dyke Brackett, M.D.    DATE OF BIRTH:  1940-01-08  DATE OF PROCEDURE:  12/27/2011 DATE OF DISCHARGE:                              OPERATIVE REPORT   INDICATION:  A 71 year old, severe varus deformity, osteoarthritis with flexion contracture of right knee, status post left total knee approximately year ago thought to be minimal hospitalization.  PREOPERATIVE DIAGNOSIS:  Severe osteoarthritis of right knee.  POSTOPERATIVE DIAGNOSIS:  Severe osteoarthritis of right knee.  OPERATION:  Right total knee replacement (Sigma cemented DePuy knee) with size 4 tibia femur 35-mm patella with 17.5-mm bearing.  SURGEON:  Dyke Brackett, M.D.  ASSISTANT:  Margart Sickles, PA-C.  TOURNIQUET TIME:  60 minutes.  DESCRIPTION OF PROCEDURE:  Sterile prep and drape, exsanguination of the leg, inflation of the tourniquet to 350.  Straight skin incision with medial parapatellar approach to the knee made.  We cut 100 mm off the distal femur with a 5-degree valgus cut due to the flexion contracture. We then cut about 2-3 mm below the most diseased medial compartment with relatively aggressive cut due to bone deficiency on the medial side after exposure and medial releasing and the extension gap was then measured at 17.5 mm.  The femur was sized to be a size 4.  We then placed 10, we used an anterior reference system off the anterior aspect of the femur and placed the 17.5-mm block on the femur as well with the cut being made off the anterior, posterior and chamfer cuts with pins in place.  Flexion gap equal to the extension gap at 17.5 mm.  Attention was next directed to the tibia.  Keel hole was cut for the tibia.  Tibial trial was placed, size 4.  A box cut was then made for the femur.  We had 0 degrees extension, flexion about 120, no  tendency to bearing spin out, good balancing.  Patella was cut leaving about 15 mm of native patella for 35-mm all poly patella.  Again, trials all deemed to be acceptable.  All trials were removed.  Bone surfaces were copiously irrigated.  We then placed the final components with the exception of the bearing tibia followed by femur patella.  Cement was allowed to harden and excess cement was removed.  We removed the trial bearing.  We checked the back of the knee for excess cement, none was noted.  We then released the tourniquet.  No excessive bleeding was noted in the posterior aspect of the knee.  Small bleeders were coagulated.  Final bearing was placed.  We then closed with #1 Ethibond, 0 and 2-0 Vicryl, skin clips.  Again prior to closure of the subcutaneous tissues, we again checked range of motion, stability, and everything was as dictated above.  Taken to the recovery room in stable condition.     Dyke Brackett, M.D.     WDC/MEDQ  D:  12/27/2011  T:  12/27/2011  Job:  [id number]

## 2011-12-27 NOTE — Progress Notes (Signed)
Orthopedic Tech Progress Note Patient Details:  Thomas Pearson 06-19-1940 [id number]  CPM Right Knee CPM Right Knee: On Right Knee Flexion (Degrees): 60  Right Knee Extension (Degrees): 0  Additional Comments: trapeze bar   Cammer, Mickie Bail 12/27/2011, 12:32 PM

## 2011-12-27 NOTE — Transfer of Care (Signed)
Immediate Anesthesia Transfer of Care Note  Patient: Thomas Pearson  Procedure(s) Performed: Procedure(s) (LRB) with comments: TOTAL KNEE ARTHROPLASTY (Right) - Right Knee Arthroplasty/Knee Medial and Lateral Compartments with Patella Resurfacing  Patient Location: PACU  Anesthesia Type:GA combined with regional for post-op pain  Level of Consciousness: awake, alert  and oriented  Airway & Oxygen Therapy: Patient Spontanous Breathing and Patient connected to nasal cannula oxygen  Post-op Assessment: Report given to PACU RN, Post -op Vital signs reviewed and stable, Patient moving all extremities and Patient able to stick tongue midline  Post vital signs: Reviewed and stable  Complications: No apparent anesthesia complications

## 2011-12-27 NOTE — Progress Notes (Signed)
CARE MANAGEMENT NOTE 12/27/2011  Patient:  Thomas Pearson, Thomas Pearson   Account Number:  [id number]  Date Initiated:  12/27/2011  Documentation initiated by:  Vance Peper  Subjective/Objective Assessment:   71 yr old male s/p right total knee arthroplasty.     Action/Plan:   CM spoke with patient concderning home health and DME needs at discharge.Patient was preoperatively setup with [facility], no changes. Patient has RW, 3in1, CPM to be delivered to his home. Has family support at discharge.   Anticipated DC Date:  12/29/2011   Anticipated DC Plan:  HOME W HOME HEALTH SERVICES      DC Planning Services  CM consult      [facility] Choice  HOME HEALTH   Choice offered to / List presented to:  C-1 Patient        HH arranged  HH-2 PT      [facility] agency  [facility]   Status of service:  Completed, signed off Medicare Important Message given?   (If response is "NO", the following Medicare IM given date fields will be blank) Date Medicare IM given:   Date Additional Medicare IM given:    Discharge Disposition:  HOME W HOME HEALTH SERVICES  Per UR Regulation:    If discussed at Long Length of Stay Meetings, dates discussed:    Comments:  12/27/11 15:57 Vance Peper, RN BSN Patient states he would like the therapist named Thomas Pearson, that He had with last surgery. Will relay this information to [place].

## 2011-12-27 NOTE — Anesthesia Preprocedure Evaluation (Addendum)
Anesthesia Evaluation  Patient identified by MRN, date of birth, ID band Patient awake    Reviewed: Allergy & Precautions, H&P , NPO status , Patient's Chart, lab work & pertinent test results  Airway Mallampati: I TM Distance: >3 FB Neck ROM: Full    Dental  (+) Edentulous Upper and Edentulous Lower   Pulmonary neg pulmonary ROS,  breath sounds clear to auscultation        Cardiovascular + dysrhythmias Rhythm:Regular Rate:Normal     Neuro/Psych  Neuromuscular disease negative psych ROS   GI/Hepatic Neg liver ROS, hiatal hernia, GERD-  Controlled and Medicated,  Endo/Other  negative endocrine ROS  Renal/GU negative Renal ROS     Musculoskeletal  (+) Arthritis -, Osteoarthritis,    Abdominal   Peds  Hematology negative hematology ROS (+)   Anesthesia Other Findings   Reproductive/Obstetrics                         Anesthesia Physical Anesthesia Plan  ASA: II  Anesthesia Plan: General   Post-op Pain Management:    Induction: Intravenous  Airway Management Planned: Oral ETT  Additional Equipment:   Intra-op Plan:   Post-operative Plan: Extubation in OR  Informed Consent: I have reviewed the patients History and Physical, chart, labs and discussed the procedure including the risks, benefits and alternatives for the proposed anesthesia with the patient or authorized representative who has indicated his/her understanding and acceptance.   Dental advisory given  Plan Discussed with: Anesthesiologist, Surgeon and CRNA  Anesthesia Plan Comments: (DJD R. Knee GERD  Plan GA with FNB   Kipp Brood, mD)       Anesthesia Quick Evaluation

## 2011-12-27 NOTE — Anesthesia Procedure Notes (Signed)
Anesthesia Regional Block:  Femoral nerve block  Pre-Anesthetic Checklist: ,, timeout performed, Correct Patient, Correct Site, Correct Laterality, Correct Procedure, Correct Position, site marked, Risks and benefits discussed,  Surgical consent,  Pre-op evaluation,  At surgeon's request and post-op pain management  Laterality: Right  Prep: chloraprep       Needles:  Injection technique: Single-shot  Needle Type: Echogenic Stimulator Needle     Needle Length:cm 9 cm Needle Gauge: 22 and 22 G    Additional Needles:  Procedures: ultrasound guided (picture in chart) and nerve stimulator Femoral nerve block Narrative:  Start time: 12/27/2011 7:00 AM End time: 12/27/2011 7:10 AM  Additional Notes: 30 cc 0.5% marcaine with 1:200 Epi injected easily  Kipp Brood, MD

## 2011-12-27 NOTE — Interval H&P Note (Signed)
History and Physical Interval Note:  12/27/2011 7:40 AM  Thomas Pearson  has presented today for surgery, with the diagnosis of osteoarthritis right knee  The various methods of treatment have been discussed with the patient and family. After consideration of risks, benefits and other options for treatment, the patient has consented to  Procedure(s) (LRB) with comments: TOTAL KNEE ARTHROPLASTY (Right) - Right Knee Arthroplasty/Knee Medial and Lateral Compartments with Patella Resurfacing as a surgical intervention .  The patient's history has been reviewed, patient examined, no change in status, stable for surgery.  I have reviewed the patient's chart and labs.  Questions were answered to the patient's satisfaction.     Telitha Plath JR,W D

## 2011-12-28 DIAGNOSIS — K219 Gastro-esophageal reflux disease without esophagitis: Secondary | ICD-10-CM | POA: Diagnosis present

## 2011-12-28 DIAGNOSIS — E785 Hyperlipidemia, unspecified: Secondary | ICD-10-CM | POA: Diagnosis present

## 2011-12-28 LAB — BASIC METABOLIC PANEL
CO2: 26 mEq/L (ref 19–32)
GFR calc non Af Amer: 79 mL/min — ABNORMAL LOW (ref >90)
Glucose, Bld: 127 mg/dL — ABNORMAL HIGH (ref 70–99)
Potassium: 3.8 mEq/L (ref 3.5–5.1)
Sodium: 136 mEq/L (ref 135–145)

## 2011-12-28 LAB — CBC
Hemoglobin: 11.4 g/dL — ABNORMAL LOW (ref 13.0–17.0)
MCHC: 34.2 g/dL (ref 30.0–36.0)
Platelets: 207 10*3/uL (ref 150–400)
RBC: 3.92 MIL/uL — ABNORMAL LOW (ref 4.22–5.81)

## 2011-12-28 NOTE — Progress Notes (Signed)
Physical Therapy Treatment Patient Details Name: Thomas Pearson MRN: [id number] DOB: 11-Dec-1940 Today's Date: 12/28/2011 Time: 4098-1191 PT Time Calculation (min): 31 min  PT Assessment / Plan / Recommendation Comments on Treatment Session  Pt in CPM when PT arrived. Pt with greater difficulty with knee extension this AM.  Emphasized the importance of knee extension with pt. Per PA note pt is WBAT on RLE.       Follow Up Recommendations  Home health PT     Does the patient have the potential to tolerate intense rehabilitation     Barriers to Discharge        Equipment Recommendations  None recommended by PT    Recommendations for Other Services    Frequency 7X/week   Plan Discharge plan remains appropriate    Precautions / Restrictions Precautions Precautions: Knee Required Braces or Orthoses: Knee Immobilizer - Right Knee Immobilizer - Right: On when out of bed or walking Restrictions Weight Bearing Restrictions: Yes RLE Weight Bearing: Weight bearing as tolerated   Pertinent Vitals/Pain 6/10 pain in R knee with mobility    Mobility  Bed Mobility Bed Mobility: Supine to Sit Supine to Sit: 6: Modified independent (Device/Increase time) Transfers Transfers: Sit to Stand;Stand to Sit Sit to Stand: With upper extremity assist;4: Min guard;From bed Stand to Sit: 4: Min guard;With upper extremity assist;To chair/3-in-1 Details for Transfer Assistance: instructional cues for safest technique Ambulation/Gait Ambulation/Gait Assistance: 4: Min assist Ambulation Distance (Feet): 15 Feet (10 feet second attempt) Assistive device: Rolling walker Ambulation/Gait Assistance Details: pt with difficulty placing r heel flat on the ground secondary to knee flexion Gait Pattern: Step-to pattern Gait velocity: decreased Stairs: No    Exercises Total Joint Exercises Ankle Circles/Pumps: AROM;Both;5 reps;Seated Quad Sets: AROM;10 reps;Supine;Right Heel Slides: Right;10  reps;AAROM;Supine Hip ABduction/ADduction: AAROM;Right;10 reps;Supine Long Arc Quad: Right;10 reps;AAROM;Seated Knee Flexion: AAROM;Right Goniometric ROM: 81 degrees flexion   PT Diagnosis:    PT Problem List:   PT Treatment Interventions:     PT Goals Acute Rehab PT Goals PT Goal Formulation: With patient Time For Goal Achievement: 01/04/12 Potential to Achieve Goals: Good Pt will go Supine/Side to Sit: with modified independence;with HOB 0 degrees PT Goal: Supine/Side to Sit - Progress: Progressing toward goal Pt will go Sit to Stand: with supervision PT Goal: Sit to Stand - Progress: Progressing toward goal Pt will Ambulate: 51 - 150 feet;with supervision;with rolling walker PT Goal: Ambulate - Progress: Progressing toward goal Pt will Go Up / Down Stairs: 1-2 stairs;with supervision;with rolling walker PT Goal: Up/Down Stairs - Progress: Other (comment) (not practiced) Pt will Perform Home Exercise Program: with supervision, verbal cues required/provided PT Goal: Perform Home Exercise Program - Progress: Progressing toward goal  Visit Information  Last PT Received On: 12/28/11 Assistance Needed: +1    Subjective Data      Cognition  Overall Cognitive Status: Appears within functional limits for tasks assessed/performed Arousal/Alertness: Awake/alert Orientation Level: Oriented X4 / Intact Behavior During Session: [facility] for tasks performed    Balance     End of Session PT - End of Session Equipment Utilized During Treatment: Gait belt;Right knee immobilizer Activity Tolerance: Patient tolerated treatment well Patient left: with call bell/phone within reach;Other (comment);in chair (knee in extension with towel roll under distal RLE ) CPM Right Knee CPM Right Knee: On       Thomas Pearson 12/28/2011, 4:19 PM Thomas Pearson, PT  [phone] 12/28/2011

## 2011-12-28 NOTE — Progress Notes (Signed)
Physical Therapy Evaluation Patient Details Name: Thomas Pearson MRN: [id number] DOB: 04-Feb-1940 Today's Date: 12/28/2011 Time: 4098-1191 PT Time Calculation (min): 22 min  PT Assessment / Plan / Recommendation Clinical Impression  71 yo M s/p R TKA.      PT Assessment  Patient needs continued PT services    Follow Up Recommendations  Home health PT       Barriers to Discharge        Equipment Recommendations  None recommended by PT    Recommendations for Other Services     Frequency 7X/week    Precautions / Restrictions Precautions Precautions: Knee Required Braces or Orthoses: Knee Immobilizer - Right Knee Immobilizer - Right: On when out of bed or walking          Mobility  Bed Mobility Bed Mobility: Sit to Supine Sit to Supine: 4: Min assist;HOB flat (Min A with RLE) Transfers Transfers: Sit to Stand;Stand to Sit Sit to Stand: 4: Min assist;With upper extremity assist;From chair/3-in-1 Stand to Sit: 4: Min guard;With upper extremity assist;To bed Details for Transfer Assistance: instructional cues for safest technique Ambulation/Gait Ambulation/Gait Assistance: 4: Min assist Ambulation Distance (Feet): 15 Feet Assistive device: Rolling walker Ambulation/Gait Assistance Details: had pt ambulate PWB since no WB status specified Gait Pattern: Step-to pattern Gait velocity: decreased    Shoulder Instructions     Exercises Total Joint Exercises Ankle Circles/Pumps: AROM;Both;10 reps;Supine Quad Sets: AROM;Right;5 reps;Supine Long Arc Quad: AAROM;Right;5 reps;Seated   PT Diagnosis: Difficulty walking  PT Problem List: Decreased strength;Decreased range of motion;Decreased mobility;Decreased knowledge of use of DME;Pain PT Treatment Interventions: DME instruction;Gait training;Stair training;Therapeutic exercise;Patient/family education   PT Goals Acute Rehab PT Goals PT Goal Formulation: With patient Time For Goal Achievement: 01/04/12 Potential to  Achieve Goals: Good Pt will go Supine/Side to Sit: with modified independence;with HOB 0 degrees PT Goal: Supine/Side to Sit - Progress: Goal set today Pt will go Sit to Stand: with supervision PT Goal: Sit to Stand - Progress: Goal set today Pt will Ambulate: 51 - 150 feet;with supervision;with rolling walker PT Goal: Ambulate - Progress: Goal set today Pt will Go Up / Down Stairs: 1-2 stairs;with supervision;with rolling walker PT Goal: Up/Down Stairs - Progress: Goal set today Pt will Perform Home Exercise Program: with supervision, verbal cues required/provided PT Goal: Perform Home Exercise Program - Progress: Goal set today  Visit Information  Last PT Received On: 12/28/11 Assistance Needed: +1    Subjective Data  Patient Stated Goal: To be able to walk   Prior Functioning  Home Living Lives With: Son Available Help at Discharge: Family;Available 24 hours/day Type of Home: House Home Access: Stairs to enter [employer] of Steps: 1 Entrance Stairs-Rails: None Home Layout: Two level;Able to live on main level with bedroom/bathroom Home Adaptive Equipment: Bedside commode/3-in-1;Straight cane;Walker - rolling Prior Function Level of Independence: Independent Able to Take Stairs?: Yes Communication Communication: No difficulties    Cognition  Overall Cognitive Status: Appears within functional limits for tasks assessed/performed Arousal/Alertness: Awake/alert Orientation Level: Oriented X4 / Intact Behavior During Session: [facility] for tasks performed    Extremity/Trunk Assessment Right Upper Extremity Assessment RUE ROM/Strength/Tone: Within functional levels Left Upper Extremity Assessment LUE ROM/Strength/Tone: Within functional levels Right Lower Extremity Assessment RLE ROM/Strength/Tone: Deficits RLE ROM/Strength/Tone Deficits: decreased ROM and strenth in R knee (Difficulty with knee extension) Left Lower Extremity Assessment LLE ROM/Strength/Tone: [facility]  for tasks assessed   Balance    End of Session PT - End of Session  Equipment Utilized During Treatment: Gait belt;Right knee immobilizer Activity Tolerance: Patient tolerated treatment well Patient left: in bed;with call bell/phone within reach;Other (comment) (with knee in full extension) CPM Right Knee CPM Right Knee: Off Right Knee Extension (Degrees):  (unable to measure secondary to bulky bandage)  GP     Donnella Sham 12/28/2011, 9:51 AM [place], PT  [phone] 12/28/2011

## 2011-12-28 NOTE — Progress Notes (Signed)
[facility]opaedic Trauma [facility] (OTS)  Subjective: 1 Day Post-Op Procedure(s) (LRB): TOTAL KNEE ARTHROPLASTY (Right)  Doing well, no issues Tolerated breakfast Using cpm Denies numbness or tingling No CP, No SOB No lightheadedness or dizziness      Objective: Current Vitals Blood pressure 120/57, pulse 70, temperature 98.2 F (36.8 C), temperature source Oral, resp. rate 18, SpO2 100.00%. Vital signs in last 24 hours: Temp:  [97.1 F (36.2 C)-98.2 F (36.8 C)] 98.2 F (36.8 C) (12/21 0531) Pulse Rate:  [55-74] 70  (12/21 0531) Resp:  [16-19] 18  (12/21 0800) BP: (120-161)/(57-79) 120/57 mmHg (12/21 0531) SpO2:  [93 %-100 %] 100 % (12/21 0800)  Intake/Output from previous day: 12/20 0701 - 12/21 0700 In: 2242.5 [I.V.:2242.5] Out: 3100 [Urine:2200; Drains:800; Blood:100] Intake/Output      12/20 0701 - 12/21 0700 12/21 0701 - 12/22 0700   I.V. 2242.5    Total Intake 2242.5    Urine 2200    Drains 800    Blood 100    Total Output 3100    Net -857.5            LABS  Basename 12/28/11 0500  HGB 11.4*    Basename 12/28/11 0500  WBC 10.3  RBC 3.92*  HCT 33.3*  PLT 207    Basename 12/28/11 0500  NA 136  K 3.8  CL 102  CO2 26  BUN 14  CREATININE 0.99  GLUCOSE 127*  CALCIUM 8.5   No results found for this basename: LABPT:2,INR:2 in the last 72 hours   Physical Exam  Gen: Awake and alert, NAD Lungs:clear Cardiac:reg, s1 and s2 Abd: + BS Ext:     Right Lower Extremity  Dressing c/d/i  Distal motor and sensory functions intact  Ext warm  Swelling stable  No dct    Imaging No results found.  Assessment/Plan: 1 Day Post-Op Procedure(s) (LRB): TOTAL KNEE ARTHROPLASTY (Right)  71 y/o male s/p R TKA  1. R TKA POD 1  WBAT  CPM  PT/OT  Ice and elevate  Total knee precautions  Dressing change tomorrow 2. Medical issues   Continue home meds 3. DVT/PE prophylaxis  Lovenox 4. FEN  Advance diet as tolerated   NSL IV  D/c IVF 5.  Dispo   Therapies today  Possible d/c home tomorrow if does well vs monday  Patient Active Hospital Problem List: HYPERCHOLESTEROLEMIA (11/24/2006) DEGENERATIVE JOINT DISEASE (11/24/2006) BENIGN PROSTATIC HYPERTROPHY, HX OF (11/24/2006) Right knee pain (11/20/2011) Hyperlipidemia () GERD (gastroesophageal reflux disease) ()   Mearl Latin, PA-C [facility] [phone] (P) 12/28/2011, 10:09 AM

## 2011-12-29 LAB — CBC
HCT: 31.2 % — ABNORMAL LOW (ref 39.0–52.0)
MCH: 29.5 pg (ref 26.0–34.0)
MCV: 88.4 fL (ref 78.0–100.0)
RBC: 3.53 MIL/uL — ABNORMAL LOW (ref 4.22–5.81)
WBC: 9.9 10*3/uL (ref 4.0–10.5)

## 2011-12-29 NOTE — Progress Notes (Signed)
Orthopaedic Trauma Service (OTS)  Subjective: 2 Days Post-Op Procedure(s) (LRB): TOTAL KNEE ARTHROPLASTY (Right)  Doing well No acute issues  Would benefit from another day of therapy  Objective: Current Vitals Blood pressure 132/66, pulse 80, temperature 99.1 F (37.3 C), temperature source Oral, resp. rate 16, SpO2 97.00%. Vital signs in last 24 hours: Temp:  [99.1 F (37.3 C)-99.7 F (37.6 C)] 99.1 F (37.3 C) (12/22 0610) Pulse Rate:  [75-87] 80  (12/22 0610) Resp:  [16-18] 16  (12/22 0610) BP: (120-136)/(59-66) 132/66 mmHg (12/22 0610) SpO2:  [96 %-100 %] 97 % (12/22 0610)  Intake/Output from previous day: 12/21 0701 - 12/22 0700 In: 800 [P.O.:800] Out: 1850 [Urine:1600; Drains:250]  LABS  Basename 12/29/11 0530 12/28/11 0500  HGB 10.4* 11.4*    Basename 12/29/11 0530 12/28/11 0500  WBC 9.9 10.3  RBC 3.53* 3.92*  HCT 31.2* 33.3*  PLT 162 207    Basename 12/28/11 0500  NA 136  K 3.8  CL 102  CO2 26  BUN 14  CREATININE 0.99  GLUCOSE 127*  CALCIUM 8.5   No results found for this basename: LABPT:2,INR:2 in the last 72 hours   Physical Exam  Gen: Awake and alert, NAD  Lungs:clear  Cardiac:reg, s1 and s2  Abd: + BS  Ext:       Right Lower Extremity   Dressing c/d/i   Incision looks excellent  No signs of infection  Drain removed without issue  Distal motor and sensory functions intact   Ext warm   Swelling stable   No dct     Imaging No results found.  Assessment/Plan: 2 Days Post-Op Procedure(s) (LRB): TOTAL KNEE ARTHROPLASTY (Right)  71 y/o male s/p R TKA   1. R TKA POD 2  WBAT    CPM   PT/OT   Ice and elevate   Total knee precautions   Dressing changed 2. Medical issues   Continue home meds  3. DVT/PE prophylaxis   Lovenox  4. FEN   Advance diet as tolerated   D/c IV  5. Dispo   Therapies today   Possible d/c home tomorrow   Patient Active Hospital Problem List: HYPERCHOLESTEROLEMIA (11/24/2006) DEGENERATIVE  JOINT DISEASE (11/24/2006) BENIGN PROSTATIC HYPERTROPHY, HX OF (11/24/2006) Right knee pain (11/20/2011) Hyperlipidemia () GERD (gastroesophageal reflux disease) ()   Mearl Latin, PA-C [facility] [phone] (P) 12/29/2011, 10:07 AM

## 2011-12-29 NOTE — Progress Notes (Signed)
Physical Therapy Treatment Patient Details Name: Thomas Pearson MRN: [id number] DOB: 25-Jan-1940 Today's Date: 12/29/2011 Time: 4098-1191 PT Time Calculation (min): 23 min  PT Assessment / Plan / Recommendation Comments on Treatment Session  Pt progressing this session, improvements in knee extensio throughout ambulation as well as safe technique maintained on stairs. Plan to d/c tomorrow    Follow Up Recommendations  Home health PT     Does the patient have the potential to tolerate intense rehabilitation     Barriers to Discharge        Equipment Recommendations  None recommended by PT    Recommendations for Other Services    Frequency 7X/week   Plan Discharge plan remains appropriate    Precautions / Restrictions Restrictions Weight Bearing Restrictions: Yes RLE Weight Bearing: Weight bearing as tolerated   Pertinent Vitals/Pain Pain 4/10. RN aware.     Mobility  Bed Mobility Bed Mobility: Sit to Supine;Supine to Sit Supine to Sit: 4: Min guard Sit to Supine: 4: Min guard Details for Bed Mobility Assistance: No assist required with RLE this session, increased time to complete Transfers Transfers: Sit to Stand;Stand to Sit Sit to Stand: With upper extremity assist;4: Min guard;From bed Stand to Sit: 4: Min guard;With upper extremity assist;To chair/3-in-1 Details for Transfer Assistance: instructional cues for safest technique and hand placement Ambulation/Gait Ambulation/Gait Assistance: 5: Supervision Ambulation Distance (Feet): 100 Feet Assistive device: Rolling walker Ambulation/Gait Assistance Details: Pt with improvements in knee extension although still maintained ~20 degrees of knee flexion during stance phase. Cues for quad contraction during ambulation Gait Pattern: Step-to pattern;Right flexed knee in stance Gait velocity: decreased Stairs: Yes Stairs Assistance: 4: Min guard Stairs Assistance Details (indicate cue type and reason): Completed stairs  with 1 rail left as well as backwards with RW. Pt preferred RW and was more comfortable with less pain. VC for proper technique and safety on both ways Stair Management Technique: One rail Left;Backwards;Forwards;With walker;Step to pattern;No rails Number of Stairs: 3     Exercises Total Joint Exercises Quad Sets: AROM;10 reps;Supine;Right Heel Slides: Right;10 reps;AAROM;Supine Long Arc Quad: Right;10 reps;AAROM;Seated   PT Diagnosis:    PT Problem List:   PT Treatment Interventions:     PT Goals Acute Rehab PT Goals PT Goal: Supine/Side to Sit - Progress: Progressing toward goal PT Goal: Sit to Stand - Progress: Progressing toward goal PT Goal: Ambulate - Progress: Progressing toward goal PT Goal: Up/Down Stairs - Progress: Progressing toward goal PT Goal: Perform Home Exercise Program - Progress: Progressing toward goal  Visit Information  Last PT Received On: 12/29/11 Assistance Needed: +1    Subjective Data      Cognition  Overall Cognitive Status: Appears within functional limits for tasks assessed/performed Arousal/Alertness: Awake/alert Orientation Level: Oriented X4 / Intact Behavior During Session: [facility] for tasks performed    Balance     End of Session PT - End of Session Equipment Utilized During Treatment: Gait belt Activity Tolerance: Patient tolerated treatment well Patient left: with call bell/phone within reach;in bed;with family/visitor present   GP     Milana Kidney 12/29/2011, 4:13 PM

## 2011-12-29 NOTE — Progress Notes (Signed)
OT EVALUATION  Past Medical History  Diagnosis Date  . Hyperlipidemia   . Pancreatitis   . Diverticulitis   . Allergy     hay fever  . GERD (gastroesophageal reflux disease)   . BPH (benign prostatic hyperplasia)   . Hiatal hernia     NO SURGERY FOR THIS  . Arthritis     osteoarthritis    Past Surgical History  Procedure Date  . Prostate surgery   . 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 Joint replacement   . Transurethral resection of prostate     12/29/11 1100  OT Visit Information  Last OT Received On 12/29/11  Assistance Needed +1  OT Time Calculation  OT Start Time 1105  OT Stop Time 1129  OT Time Calculation (min) 24 min  Precautions  Precautions Knee  Restrictions  RLE Weight Bearing WBAT  Home Living  Lives With Son  Available Help at Discharge Family;Available 24 hours/day  Type of Home House  Home Access Stairs to enter  Entrance Stairs-Number of Steps 1  Entrance Stairs-Rails None  Home Layout Two level;Able to live on main level with bedroom/bathroom  Bathroom Shower/Tub Walk-in shower  Bathroom Toilet Handicapped height  Bathroom Accessibility Yes  How Accessible Accessible via walker  Home Adaptive Equipment Bedside commode/3-in-1;Straight cane;Walker - rolling  Prior Function  Level of Independence Independent  Able to Take Stairs? Yes  Driving Yes  Communication  Communication No difficulties  ADL  Grooming Supervision/safety;Set up  Where Assessed - Grooming Unsupported sitting  Upper Body Bathing Set up;Supervision/safety  Where Assessed - Upper Body Bathing Unsupported sitting  Lower Body Bathing Supervision/safety;Set up  Where Assessed - Lower Body Bathing Unsupported sit to stand  Upper Body Dressing Supervision/safety;Set up  Where Assessed - Upper Body Dressing Unsupported  sitting  Lower Body Dressing Set up;Supervision/safety  Where Assessed - Lower Body Dressing Unsupported sit to stand  Toilet Transfer Supervision/safety  Toilet Transfer Method Sit to stand  Toilet Transfer Equipment Comfort height toilet  Toileting - Clothing Manipulation and Hygiene Modified independent  Where Assessed - Sales promotion account executive with back  Equipment Used Gait belt;Rolling walker  Transfers/Ambulation Related to ADLs S  ADL Comments completed all ADL education regarding AE and DME  Cognition  Overall Cognitive Status Appears within functional limits for tasks assessed/performed  Arousal/Alertness Awake/alert  Orientation Level Oriented X4 / Intact  Behavior During Session [facility] for tasks performed  Right Upper Extremity Assessment  RUE ROM/Strength/Tone [facility] for tasks assessed  Left Upper Extremity Assessment  LUE ROM/Strength/Tone WFL for tasks assessed  Right Lower Extremity Assessment  RLE ROM/Strength/Tone Deficits  Left Lower Extremity Assessment  LLE ROM/Strength/Tone WFL for tasks assessed  Bed Mobility  Bed Mobility Supine to Sit;Sitting - Scoot to Edge of Bed  Supine to Sit 6: Modified independent (Device/Increase time);HOB flat  Sitting - Scoot to Edge of Bed 6: Modified independent (Device/Increase time)  Transfers  Transfers Sit to Stand;Stand to Sit  Sit to Stand 5: Supervision;With upper extremity assist;From chair/3-in-1  Stand to Sit 5: Supervision;With upper extremity assist;To chair/3-in-1  OT - End of Session  Equipment Utilized During Treatment Gait belt  Activity Tolerance Patient tolerated treatment well  Patient left in  chair;with call bell/phone within reach  Nurse Communication Mobility status  OT Assessment  Clinical Impression Statement excellent progress. Completed all information  regarding compensatory techniques for ADL. Pt has all nec DME, Pt will have adequate S for home D/C tomorrow.  OT Recommendation/Assessment Patient does not need any further OT services  OT Recommendation  Follow Up Recommendations No OT follow up  OT Equipment None recommended by OT  Acute Rehab OT Goals  OT Goal Formulation (eval only)  OT General Charges  $OT Visit 1 Procedure  OT Evaluation  $Initial OT Evaluation Tier I 1 Procedure  OT Treatments  $Self Care/Home Management  23-37 mins  Written Expression  Dominant Hand Right   Luisa Dago, OTR/L  [phone] 12/29/2011

## 2011-12-29 NOTE — Progress Notes (Signed)
Physical Therapy Treatment Patient Details Name: Thomas Pearson MRN: [id number] DOB: [birth year] Today's Date: 12/29/2011 Time: 1308-6578 PT Time Calculation (min): 25 min  PT Assessment / Plan / Recommendation Comments on Treatment Session  Pt moving well, although still limited in knee extension. Stressed to pt the need for increased knee extension during mobility and rest.    Follow Up Recommendations  Home health PT     Does the patient have the potential to tolerate intense rehabilitation     Barriers to Discharge        Equipment Recommendations  None recommended by PT    Recommendations for Other Services    Frequency 7X/week   Plan Discharge plan remains appropriate    Precautions / Restrictions Restrictions Weight Bearing Restrictions: Yes RLE Weight Bearing: Weight bearing as tolerated   Pertinent Vitals/Pain Pain 5/10. RN aware and pain meds given after session    Mobility  Bed Mobility Bed Mobility: Supine to Sit Supine to Sit: 4: Min assist Details for Bed Mobility Assistance: Assist with RLE Transfers Transfers: Sit to Stand;Stand to Sit Sit to Stand: With upper extremity assist;4: Min guard;From bed Stand to Sit: 4: Min guard;With upper extremity assist;To chair/3-in-1 Details for Transfer Assistance: instructional cues for safest technique Ambulation/Gait Ambulation/Gait Assistance: 4: Min guard Ambulation Distance (Feet): 100 Feet Assistive device: Rolling walker Ambulation/Gait Assistance Details: Moderate cueing throughout ambulation for increased knee extension during stance phase. Improved with time Gait Pattern: Step-to pattern Gait velocity: decreased    Exercises Total Joint Exercises Quad Sets: AROM;10 reps;Supine;Right Heel Slides: Right;10 reps;AAROM;Supine Long Arc Quad: Right;10 reps;AAROM;Seated   PT Diagnosis:    PT Problem List:   PT Treatment Interventions:     PT Goals Acute Rehab PT Goals PT Goal: Supine/Side to Sit -  Progress: Progressing toward goal PT Goal: Sit to Stand - Progress: Progressing toward goal PT Goal: Ambulate - Progress: Progressing toward goal PT Goal: Perform Home Exercise Program - Progress: Progressing toward goal  Visit Information  Last PT Received On: 12/29/11 Assistance Needed: +1    Subjective Data      Cognition  Overall Cognitive Status: Appears within functional limits for tasks assessed/performed Arousal/Alertness: Awake/alert Orientation Level: Oriented X4 / Intact Behavior During Session: [facility] for tasks performed    Balance     End of Session PT - End of Session Equipment Utilized During Treatment: Gait belt Activity Tolerance: Patient tolerated treatment well Patient left: with call bell/phone within reach;Other (comment);in chair   GP     Milana Kidney 12/29/2011, 1:06 PM

## 2011-12-30 ENCOUNTER — Encounter (HOSPITAL_COMMUNITY): Payer: Self-pay | Admitting: Orthopedic Surgery

## 2011-12-30 DIAGNOSIS — Z79899 Other long term (current) drug therapy: Secondary | ICD-10-CM | POA: Insufficient documentation

## 2011-12-30 DIAGNOSIS — Z96659 Presence of unspecified artificial knee joint: Secondary | ICD-10-CM | POA: Insufficient documentation

## 2011-12-30 DIAGNOSIS — D126 Benign neoplasm of colon, unspecified: Secondary | ICD-10-CM | POA: Insufficient documentation

## 2011-12-30 DIAGNOSIS — Z8719 Personal history of other diseases of the digestive system: Secondary | ICD-10-CM | POA: Insufficient documentation

## 2011-12-30 DIAGNOSIS — E785 Hyperlipidemia, unspecified: Secondary | ICD-10-CM | POA: Insufficient documentation

## 2011-12-30 DIAGNOSIS — Z87891 Personal history of nicotine dependence: Secondary | ICD-10-CM | POA: Insufficient documentation

## 2011-12-30 DIAGNOSIS — I4891 Unspecified atrial fibrillation: Secondary | ICD-10-CM | POA: Diagnosis not present

## 2011-12-30 DIAGNOSIS — M1990 Unspecified osteoarthritis, unspecified site: Secondary | ICD-10-CM | POA: Insufficient documentation

## 2011-12-30 DIAGNOSIS — L539 Erythematous condition, unspecified: Secondary | ICD-10-CM | POA: Diagnosis not present

## 2011-12-30 DIAGNOSIS — T847XXA Infection and inflammatory reaction due to other internal orthopedic prosthetic devices, implants and grafts, initial encounter: Secondary | ICD-10-CM | POA: Diagnosis not present

## 2011-12-30 DIAGNOSIS — Z471 Aftercare following joint replacement surgery: Secondary | ICD-10-CM | POA: Diagnosis not present

## 2011-12-30 DIAGNOSIS — R509 Fever, unspecified: Secondary | ICD-10-CM | POA: Diagnosis not present

## 2011-12-30 DIAGNOSIS — M25569 Pain in unspecified knee: Secondary | ICD-10-CM | POA: Insufficient documentation

## 2011-12-30 DIAGNOSIS — K219 Gastro-esophageal reflux disease without esophagitis: Secondary | ICD-10-CM | POA: Insufficient documentation

## 2011-12-30 LAB — CBC
HCT: 27.6 % — ABNORMAL LOW (ref 39.0–52.0)
Hemoglobin: 9.3 g/dL — ABNORMAL LOW (ref 13.0–17.0)
MCH: 29.7 pg (ref 26.0–34.0)
MCHC: 33.7 g/dL (ref 30.0–36.0)
RDW: 12.7 % (ref 11.5–15.5)

## 2011-12-30 NOTE — Progress Notes (Signed)
Physical Therapy Treatment Patient Details Name: Thomas Pearson MRN: [id number] DOB: 26-May-1940 Today's Date: 12/30/2011 Time: 7829-5621 PT Time Calculation (min): 24 min  PT Assessment / Plan / Recommendation Comments on Treatment Session  Patient progressing well to discharge home today per goals set. Patient has assitance from his son as needed. Patient declined further stair practice this morning    Follow Up Recommendations  Home health PT     Does the patient have the potential to tolerate intense rehabilitation     Barriers to Discharge        Equipment Recommendations  None recommended by PT    Recommendations for Other Services    Frequency 7X/week   Plan Discharge plan remains appropriate;Frequency remains appropriate    Precautions / Restrictions Restrictions RLE Weight Bearing: Weight bearing as tolerated   Pertinent Vitals/Pain     Mobility  Bed Mobility Supine to Sit: 5: Supervision Sitting - Scoot to Edge of Bed: 5: Supervision Transfers Sit to Stand: 5: Supervision Stand to Sit: 5: Supervision Ambulation/Gait Ambulation/Gait Assistance: 5: Supervision Ambulation Distance (Feet): 250 Feet Assistive device: Rolling walker Ambulation/Gait Assistance Details: Paitient with btter heel strike and extension of R knee in stance phase Gait Pattern: Step-through pattern;Decreased stride length Stairs: No    Exercises Total Joint Exercises Quad Sets: AROM;Right;10 reps Short Arc QuadBarbaraann Boys;Right;10 reps Heel Slides: AAROM;Right;10 reps Hip ABduction/ADduction: AAROM;Right;10 reps Straight Leg Raises: AAROM;Right;10 reps   PT Diagnosis:    PT Problem List:   PT Treatment Interventions:     PT Goals Acute Rehab PT Goals PT Goal: Supine/Side to Sit - Progress: Progressing toward goal PT Goal: Sit to Stand - Progress: Progressing toward goal PT Goal: Ambulate - Progress: Progressing toward goal PT Goal: Perform Home Exercise Program - Progress:  Progressing toward goal  Visit Information  Last PT Received On: 12/30/11 Assistance Needed: +1    Subjective Data      Cognition  Overall Cognitive Status: Appears within functional limits for tasks assessed/performed Arousal/Alertness: Awake/alert Orientation Level: Appears intact for tasks assessed Behavior During Session: [facility] for tasks performed    Balance     End of Session PT - End of Session Equipment Utilized During Treatment: Gait belt Activity Tolerance: Patient tolerated treatment well Patient left: in chair;with call bell/phone within reach Nurse Communication: Mobility status   GP     Fredrich Birks 12/30/2011, 10:26 AM  12/30/2011 Fredrich Birks PTA [phone] pager [phone] office

## 2011-12-30 NOTE — Progress Notes (Signed)
Subjective: 3 Days Post-Op Procedure(s) (LRB): TOTAL KNEE ARTHROPLASTY (Right) Patient reports pain as mild and moderate.    Objective: Vital signs in last 24 hours: Temp:  [97.9 F (36.6 C)-98.6 F (37 C)] 98 F (36.7 C) (12/23 0650) Pulse Rate:  [64-89] 64  (12/23 0650) Resp:  [18-20] 20  (12/23 0650) BP: (136-148)/(65-84) 136/65 mmHg (12/23 0650) SpO2:  [95 %-99 %] 99 % (12/23 0650)  Intake/Output from previous day: 12/22 0701 - 12/23 0700 In: 660 [P.O.:660] Out: 400 [Urine:400] Intake/Output this shift:     Basename 12/30/11 0540 12/29/11 0530 12/28/11 0500  HGB 9.3* 10.4* 11.4*    Basename 12/30/11 0540 12/29/11 0530  WBC 9.6 9.9  RBC 3.13* 3.53*  HCT 27.6* 31.2*  PLT 160 162    Basename 12/28/11 0500  NA 136  K 3.8  CL 102  CO2 26  BUN 14  CREATININE 0.99  GLUCOSE 127*  CALCIUM 8.5   No results found for this basename: LABPT:2,INR:2 in the last 72 hours  Neurovascular intact Sensation intact distally Intact pulses distally Dorsiflexion/Plantar flexion intact Incision: dressing C/D/I No cellulitis present Compartment soft  Assessment/Plan: 3 Days Post-Op Procedure(s) (LRB): TOTAL KNEE ARTHROPLASTY (Right) Discharge home with home health Dressing change prior to d/c lovenox review for self administration   Margart Sickles 12/30/2011, 8:53 AM

## 2011-12-30 NOTE — Discharge Summary (Signed)
[facility]  PATIENT ID: DONTREAL MIERA        MRN:  [id number]          DOB/AGE: May 03, 1940 / 71 y.o.    DISCHARGE SUMMARY  ADMISSION DATE:    12/27/2011 DISCHARGE DATE:   12/30/2011   ADMISSION DIAGNOSIS: osteoarthritis right knee right knee osteoarthritis    DISCHARGE DIAGNOSIS:  osteoarthritis right knee    ADDITIONAL DIAGNOSIS: Active Problems:  HYPERCHOLESTEROLEMIA  DEGENERATIVE JOINT DISEASE  BENIGN PROSTATIC HYPERTROPHY, HX OF  Right knee pain  Hyperlipidemia  GERD (gastroesophageal reflux disease)  Past Medical History  Diagnosis Date  . Hyperlipidemia   . Pancreatitis   . Diverticulitis   . Allergy     hay fever  . GERD (gastroesophageal reflux disease)   . BPH (benign prostatic hyperplasia)   . Hiatal hernia     NO SURGERY FOR THIS  . Arthritis     osteoarthritis    PROCEDURE: Procedure(s): TOTAL KNEE ARTHROPLASTY  Right  on 12/27/2011  CONSULTS:     HISTORY:  See H&P in chart  HOSPITAL COURSE:  Thomas Pearson is a 71 y.o. admitted on 12/27/2011 and found to have a diagnosis of osteoarthritis right knee.  After appropriate laboratory studies were obtained  they were taken to the operating room on 12/27/2011 and underwent Procedure(s): TOTAL KNEE ARTHROPLASTY  Right.   They were given perioperative antibiotics:  Anti-infectives     Start     Dose/Rate Route Frequency Ordered Stop   12/27/11 1300   ceFAZolin (ANCEF) IVPB 1 g/50 mL premix        1 g 100 mL/hr over 30 Minutes Intravenous Every 6 hours 12/27/11 1155 12/27/11 1810   12/26/11 1423   ceFAZolin (ANCEF) IVPB 2 g/50 mL premix        2 g 100 mL/hr over 30 Minutes Intravenous 60 min pre-op 12/26/11 1423 12/27/11 0745        .  Tolerated the procedure well.  Placed with a foley intraoperatively.  Given Ofirmev at induction.    POD #1, allowed out of bed to a chair.  PT for ambulation and exercise program.  Foley D/C'd in morning.  IV saline locked.  O2 discontionued.  POD #2,  continued PT and ambulation.   Hemovac pulled.  POD#3 did well with PT   The remainder of the hospital course was dedicated to ambulation and strengthening.   The patient was discharged on 3 Days Post-Op in  Stable condition.  Blood products given:none  DIAGNOSTIC STUDIES: Recent vital signs: Patient Vitals for the past 24 hrs:  BP Temp Temp src Pulse Resp SpO2  12/30/11 0650 136/65 mmHg 98 F (36.7 C) - 64  20  99 %  2012/01/12 2140 148/84 mmHg 97.9 F (36.6 C) - 89  20  95 %  01-12-12 1348 143/68 mmHg 98.6 F (37 C) Oral 75  18  95 %       Recent laboratory studies:  Basename 12/30/11 0540 01-12-2012 0530 12/28/11 0500  WBC 9.6 9.9 10.3  HGB 9.3* 10.4* 11.4*  HCT 27.6* 31.2* 33.3*  PLT 160 162 207    Basename 12/28/11 0500  NA 136  K 3.8  CL 102  CO2 26  BUN 14  CREATININE 0.99  GLUCOSE 127*  CALCIUM 8.5   Lab Results  Component Value Date   INR 0.95 12/20/2011   INR 1.0 05/06/2011   INR 2.29* 01/24/2011     Recent Radiographic Studies :  No  results found.  DISCHARGE INSTRUCTIONS:   DISCHARGE MEDICATIONS:     Medication List     As of 12/30/2011  8:55 AM    STOP taking these medications         aspirin EC 81 MG tablet      TAKE these medications         docusate sodium 100 MG capsule   Commonly known as: COLACE   Take 100 mg by mouth 2 (two) times daily.      enoxaparin 30 MG/0.3ML injection   Commonly known as: LOVENOX   Inject 0.3 mLs (30 mg total) into the skin every 12 (twelve) hours.      HYDROcodone-acetaminophen 7.5-325 MG per tablet   Commonly known as: NORCO   1-2 tabs po q4-6hrs prn pain      methocarbamol 500 MG tablet   Commonly known as: ROBAXIN   Take 1 tablet (500 mg total) by mouth 4 (four) times daily as needed. Muscle spasm      omeprazole 20 MG capsule   Commonly known as: PRILOSEC   Take 20 mg by mouth daily.      rosuvastatin 20 MG tablet   Commonly known as: CRESTOR   Take 20 mg by mouth at bedtime.         FOLLOW UP VISIT:       Follow-up Information    Schedule an appointment as soon as possible for a visit with CAFFREY JR,W D, MD. (to be seen on 01/09/2012)    Contact information:   [address] [place] [address] [phone]          DISPOSITION:  Home with HHPT  CONDITION:  {Stable  Margart Sickles 12/30/2011, 8:55 AM

## 2011-12-30 NOTE — Progress Notes (Signed)
Dressing changed to silicone mepilex dressing before discharge. Patient instructed how to change dressing after being discharged.  Discharge instructions completed with patient and Son at bedside for reinforcement at home. Patient educated about lovenox injections.  All questions answered. Prescriptions given to patient. Patient then discharged to private car by volunteer services.

## 2011-12-30 NOTE — ED Notes (Signed)
Family at bedside.Son and daughter 

## 2011-12-30 NOTE — ED Notes (Signed)
He had a rt knee placement on Friday and he just went home earlier today.  Temp tonight and the pt has redness around the rt knee incision.  Not feeling very well all day

## 2011-12-31 ENCOUNTER — Emergency Department (HOSPITAL_COMMUNITY)
Admission: EM | Admit: 2011-12-31 | Discharge: 2011-12-31 | Disposition: A | Discharge status: Home / Self Care | Payer: Medicare Other | Attending: Emergency Medicine | Admitting: Emergency Medicine

## 2011-12-31 ENCOUNTER — Emergency Department (HOSPITAL_COMMUNITY): Payer: Medicare Other

## 2011-12-31 DIAGNOSIS — IMO0002 Reserved for concepts with insufficient information to code with codable children: Secondary | ICD-10-CM | POA: Diagnosis not present

## 2011-12-31 DIAGNOSIS — Z96659 Presence of unspecified artificial knee joint: Secondary | ICD-10-CM | POA: Diagnosis not present

## 2011-12-31 DIAGNOSIS — M25561 Pain in right knee: Secondary | ICD-10-CM

## 2011-12-31 DIAGNOSIS — T847XXA Infection and inflammatory reaction due to other internal orthopedic prosthetic devices, implants and grafts, initial encounter: Secondary | ICD-10-CM | POA: Diagnosis not present

## 2011-12-31 DIAGNOSIS — R509 Fever, unspecified: Secondary | ICD-10-CM | POA: Diagnosis not present

## 2011-12-31 DIAGNOSIS — IMO0001 Reserved for inherently not codable concepts without codable children: Secondary | ICD-10-CM | POA: Diagnosis not present

## 2011-12-31 DIAGNOSIS — L539 Erythematous condition, unspecified: Secondary | ICD-10-CM | POA: Diagnosis not present

## 2011-12-31 DIAGNOSIS — Z7901 Long term (current) use of anticoagulants: Secondary | ICD-10-CM | POA: Diagnosis not present

## 2011-12-31 DIAGNOSIS — D126 Benign neoplasm of colon, unspecified: Secondary | ICD-10-CM

## 2011-12-31 DIAGNOSIS — M7989 Other specified soft tissue disorders: Secondary | ICD-10-CM

## 2011-12-31 DIAGNOSIS — I480 Paroxysmal atrial fibrillation: Secondary | ICD-10-CM

## 2011-12-31 DIAGNOSIS — Z471 Aftercare following joint replacement surgery: Secondary | ICD-10-CM | POA: Diagnosis not present

## 2011-12-31 LAB — BASIC METABOLIC PANEL
Calcium: 8.8 mg/dL (ref 8.4–10.5)
GFR calc Af Amer: 83 mL/min — ABNORMAL LOW (ref >90)
GFR calc non Af Amer: 72 mL/min — ABNORMAL LOW (ref >90)
Glucose, Bld: 140 mg/dL — ABNORMAL HIGH (ref 70–99)
Sodium: 134 mEq/L — ABNORMAL LOW (ref 135–145)

## 2011-12-31 LAB — CBC WITH DIFFERENTIAL/PLATELET
Basophils Absolute: 0 10*3/uL (ref 0.0–0.1)
Basophils Relative: 0 % (ref 0–1)
Eosinophils Absolute: 0.2 10*3/uL (ref 0.0–0.7)
Eosinophils Relative: 2 % (ref 0–5)
MCH: 29.8 pg (ref 26.0–34.0)
MCHC: 34.3 g/dL (ref 30.0–36.0)
MCV: 87 fL (ref 78.0–100.0)
Platelets: 194 10*3/uL (ref 150–400)
RDW: 12.6 % (ref 11.5–15.5)

## 2011-12-31 MED ORDER — ONDANSETRON HCL 4 MG/2ML IJ SOLN
4.0000 mg | Freq: Once | INTRAMUSCULAR | Status: AC
  Administered 2011-12-31: 4 mg via INTRAVENOUS
  Filled 2011-12-31: qty 2

## 2011-12-31 MED ORDER — FENTANYL CITRATE 0.05 MG/ML IJ SOLN
25.0000 ug | Freq: Once | INTRAMUSCULAR | Status: AC
  Administered 2011-12-31: 25 ug via INTRAVENOUS
  Filled 2011-12-31: qty 2

## 2011-12-31 MED ORDER — HYDROCODONE-ACETAMINOPHEN 5-325 MG PO TABS: 2.0000 | ORAL_TABLET | ORAL | Status: DC | PRN

## 2011-12-31 MED ORDER — HYDROCODONE-ACETAMINOPHEN 5-325 MG PO TABS
2.0000 | ORAL_TABLET | Freq: Once | ORAL | Status: AC
  Administered 2011-12-31: 2 via ORAL
  Filled 2011-12-31 (×2): qty 2

## 2011-12-31 NOTE — ED Notes (Signed)
Ortho here to see pt.

## 2011-12-31 NOTE — ED Notes (Signed)
Ice Pad applied to patient rt leg for swelling/ Notified PA for pain medication IV

## 2011-12-31 NOTE — ED Provider Notes (Signed)
Patient complains of right knee pain with drainage on bandage noted tonight and temperature of 101.5 yesterday he treated himself with Tylenol. He denies cough denies chest pain denies headache denies abdominal pain admits to diminished appetite. On exam patient nontoxic alert lungs clear auscultation heart regular rate and rhythm abdomen nondistended nontender right lower extremity with surgical wound over anterior knee, and knee is swollen, mildly tender, not red DP pulse 2+ Spoke with Dr. Sherlean Foot who will evaluate patient in the emergency dept  Doug Sou, MD 12/31/11 [phone]

## 2011-12-31 NOTE — Progress Notes (Signed)
Right:  No evidence of DVT, superficial thrombosis, or Baker's cyst.  Left:  Negative for DVT in the common femoral vein.  

## 2011-12-31 NOTE — Consult Note (Signed)
Reason for Consult:Right knee pain with possible infection  Referring Physician: Remi Haggard, MD  Thomas Pearson is an 71 y.o. male.  HPI: TKA this past Friday by Dr. Madelon Lips  Past Medical History  Diagnosis Date  . Hyperlipidemia   . Pancreatitis   . Diverticulitis   . Allergy     hay fever  . GERD (gastroesophageal reflux disease)   . BPH (benign prostatic hyperplasia)   . Hiatal hernia     NO SURGERY FOR THIS  . Arthritis     osteoarthritis    Past Surgical History  Procedure Date  . Prostate surgery   . 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 Joint replacement   . Transurethral resection of prostate   . 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 Cancer Brother     prostate  . Colon cancer Neg Hx   . Heart disease Father     Social History:  reports that he quit smoking about 47 years ago. His smoking use included Cigarettes. He quit smokeless tobacco use about 46 years ago. He reports that he does not drink alcohol or use illicit drugs.  Allergies:  Allergies  Allergen Reactions  . Oxycodone-Acetaminophen Hives    Medications: I have reviewed the patient's current medications.  Results for orders placed during the hospital encounter of 12/31/11 (from the past 48 hour(s))  CBC WITH DIFFERENTIAL     Status: Abnormal   Collection Time   12/31/11  1:33 AM      Component Value Range Comment   WBC 9.7  4.0 - 10.5 K/uL    RBC 3.15 (*) 4.22 - 5.81 MIL/uL    Hemoglobin 9.4 (*) 13.0 - 17.0 g/dL    HCT 16.1 (*) 09.6 - 52.0 %    MCV 87.0  78.0 - 100.0 fL     MCH 29.8  26.0 - 34.0 pg    MCHC 34.3  30.0 - 36.0 g/dL    RDW 04.5  40.9 - 81.1 %    Platelets 194  150 - 400 K/uL    Neutrophils Relative 71  43 - 77 %    Neutro Abs 6.9  1.7 - 7.7 K/uL    Lymphocytes Relative 16  12 - 46 %    Lymphs Abs 1.5  0.7 - 4.0 K/uL    Monocytes Relative 12  3 - 12 %    Monocytes Absolute 1.1 (*) 0.1 - 1.0 K/uL    Eosinophils Relative 2  0 - 5 %    Eosinophils Absolute 0.2  0.0 - 0.7 K/uL    Basophils Relative 0  0 - 1 %    Basophils Absolute 0.0  0.0 - 0.1 K/uL   BASIC METABOLIC PANEL     Status: Abnormal   Collection Time   12/31/11  1:33 AM      Component Value Range Comment   Sodium 134 (*) 135 - 145 mEq/L    Potassium 4.0  3.5 - 5.1 mEq/L    Chloride 98  96 - 112 mEq/L  CO2 26  19 - 32 mEq/L    Glucose, Bld 140 (*) 70 - 99 mg/dL    BUN 16  6 - 23 mg/dL    Creatinine, Ser 9.60  0.50 - 1.35 mg/dL    Calcium 8.8  8.4 - 45.4 mg/dL    GFR calc non Af Amer 72 (*) >90 mL/min    GFR calc Af Amer 83 (*) >90 mL/min     Dg Chest 2 View  12/31/2011  *RADIOLOGY REPORT*  Clinical Data: Fever after knee surgery; drainage and erythema at the surgical site.  CHEST - 2 VIEW  Comparison: Chest radiograph performed 11/20/2011  Findings: The lungs are well-aerated and clear.  There is no evidence of focal opacification, pleural effusion or pneumothorax. Minimal density at the peripheral right midlung zone is thought to reflect chronic atelectasis or scarring, given stability from multiple prior studies.  The heart is normal in size; the mediastinal contour is within normal limits.  No acute osseous abnormalities are seen.  IMPRESSION: No acute cardiopulmonary process seen.   Original Report Authenticated By: Tonia Ghent, M.D.    Dg Knee Complete 4 Views Right  12/31/2011  *RADIOLOGY REPORT*  Clinical Data: Status post right total knee arthroplasty, with erythema and drainage at the surgical site.  RIGHT KNEE - COMPLETE 4+ VIEW  Comparison: None.  Findings: The  right total knee arthroplasty appears grossly intact, without evidence of loosening or fracture.  Scattered soft tissue air is noted in the prepatellar soft tissues, with associated soft tissue swelling extending inferiorly.  A small knee joint effusion is noted.  Overlying skin staples are seen.  There is no definite evidence of osseous erosion at this time.  IMPRESSION:  1.  Right total knee arthroplasty appears grossly intact, without evidence of loosening or fracture. 2.  Soft tissue swelling involving the prepatellar and infrapatellar soft tissues; scattered soft tissue air noted in the prepatellar soft tissues. 3.  Small knee joint effusion noted.   Original Report Authenticated By: Tonia Ghent, M.D.     Review of Systems  Constitutional: Negative.   HENT: Negative.   Eyes: Negative.   Respiratory: Negative.   Cardiovascular: Negative.   Gastrointestinal: Negative.   Genitourinary: Negative.   Musculoskeletal: Negative.   Skin: Negative.   Neurological: Negative.   Endo/Heme/Allergies: Negative.   Psychiatric/Behavioral: Negative.    Blood pressure 155/72, pulse 82, temperature 98.8 F (37.1 C), temperature source Oral, resp. rate 16, SpO2 97.00%. Physical Exam  Constitutional: He appears well-developed and well-nourished.  Musculoskeletal:       Knee is moderately swollen with effusion.  There is no signs of deep infection.  Skin: Skin is warm and dry.    Assessment/Plan: Follow up with Dr. Madelon Lips next week.  Zoelle Markus,STEPHEN D 12/31/2011, 8:53 AM

## 2011-12-31 NOTE — ED Provider Notes (Signed)
History     CSN: [id number]  Arrival date & time 12/30/11  2148   First MD Initiated Contact with Patient 12/31/11 [phone]      Chief Complaint  Patient presents with  . Fever    (Consider location/radiation/quality/duration/timing/severity/associated sxs/prior treatment) HPI Comments: Patient had knee replacement surgery by Dr. Madelon Lips on December 20.  He was discharged from the hospital on December 23 since that time.  He has been anorexic.  He spiked a temperature to over 101.5.  Last night.  He was given Tylenol with good resolution of his fever, but he did have chills.  Prior to that is also a increased pain, and swelling at the surgical site.  His son states, that when he changed the dressing had minimal discharge.  No active bleeding.  Patient and son called orthopedist on-call, and informed him to come to the emergency department, due to fever  The history is provided by the patient.    Past Medical History  Diagnosis Date  . Hyperlipidemia   . Pancreatitis   . Diverticulitis   . Allergy     hay fever  . GERD (gastroesophageal reflux disease)   . BPH (benign prostatic hyperplasia)   . Hiatal hernia     NO SURGERY FOR THIS  . Arthritis     osteoarthritis    Past Surgical History  Procedure Date  . Prostate surgery   . 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 Joint replacement   . Transurethral resection of prostate   . 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 Cancer Brother     prostate  . Colon cancer Neg Hx   . Heart disease Father     History    Substance Use Topics  . Smoking status: Former Smoker    Types: Cigarettes    Quit date: 01/07/1965  . Smokeless tobacco: Former Neurosurgeon    Quit date: 12/16/1965  . Alcohol Use: No      Review of Systems  Constitutional: Positive for fever and chills.  HENT: Negative.   Eyes: Negative.   Respiratory: Negative for cough and shortness of breath.   Cardiovascular: Negative for leg swelling.  Genitourinary: Negative for dysuria.  Musculoskeletal: Positive for joint swelling.  Neurological: Negative for numbness.    Allergies  Oxycodone-acetaminophen  Home Medications   Current Outpatient Rx  Name  Route  Sig  Dispense  Refill  . DOCUSATE SODIUM 100 MG PO CAPS   Oral   Take 100 mg by mouth 2 (two) times daily.         [name] Kitchen ENOXAPARIN SODIUM 30 MG/0.3ML [place] SOLN   Subcutaneous   Inject 0.3 mLs (30 mg total) into the skin every 12 (twelve) hours.   24 Syringe   0   . HYDROCODONE-ACETAMINOPHEN 7.5-325 MG PO TABS   Oral   Take 1 tablet by mouth every 6 (six) hours as needed. For pain         . METHOCARBAMOL 500 MG PO TABS   Oral   Take 1 tablet (500 mg total) by  mouth 4 (four) times daily as needed. Muscle spasm   60 tablet   0   . OMEPRAZOLE 20 MG PO CPDR   Oral   Take 20 mg by mouth daily.         [name] Kitchen ROSUVASTATIN CALCIUM 20 MG PO TABS   Oral   Take 20 mg by mouth at bedtime.           BP 131/65  Pulse 91  Temp 99.2 F (37.3 C) (Oral)  Resp 20  SpO2 94%  Physical Exam  Constitutional: He is oriented to person, place, and time. He appears well-developed and well-nourished.  HENT:  Head: Atraumatic.  Eyes: Pupils are equal, round, and reactive to light.  Cardiovascular: Normal rate.   Pulmonary/Chest: Effort normal. No respiratory distress. He has no rales.  Abdominal: Soft.  Musculoskeletal: He exhibits edema and tenderness.       Legs: Neurological: He is alert and oriented to person, place, and time.  Skin: Skin is warm. Rash noted. No erythema.     ED Course  Procedures (including critical care time)  Labs Reviewed  CBC WITH DIFFERENTIAL - Abnormal; Notable for the following:    RBC 3.15 (*)     Hemoglobin 9.4 (*)     HCT 27.4 (*)     Monocytes Absolute 1.1 (*)     All other components within normal limits  BASIC METABOLIC PANEL - Abnormal; Notable for the following:    Sodium 134 (*)     Glucose, Bld 140 (*)     GFR calc non Af Amer 72 (*)     GFR calc Af Amer 83 (*)     All other components within normal limits   Dg Chest 2 View  12/31/2011  *RADIOLOGY REPORT*  Clinical Data: Fever after knee surgery; drainage and erythema at the surgical site.  CHEST - 2 VIEW  Comparison: Chest radiograph performed 11/20/2011  Findings: The lungs are well-aerated and clear.  There is no evidence of focal opacification, pleural effusion or pneumothorax. Minimal density at the peripheral right midlung zone is thought to reflect chronic atelectasis or scarring, given stability from multiple prior studies.  The heart is normal in size; the mediastinal contour is within normal limits.  No acute osseous abnormalities are seen.  IMPRESSION: No acute cardiopulmonary process seen.   Original Report Authenticated By: Tonia Ghent, M.D.      No diagnosis found.    MDM  Dr. Rennis Chris discussed with Dr. Valentina Gu ED evaluation.  Someone from that orthopedic team will be by in the morning to evaluate patient to determine need for admission.  In the meantime, will monitor temperature and waiting for UA results         Arman Filter, NP 12/31/11 0407

## 2011-12-31 NOTE — ED Provider Notes (Signed)
Medical screening examination/treatment/procedure(s) were conducted as a shared visit with non-physician practitioner(s) and myself.  I personally evaluated the patient during the encounter  Doug Sou, MD 12/31/11 [phone]

## 2011-12-31 NOTE — ED Provider Notes (Signed)
7:46 AM Patient with a hx sig for right knee pain and swelling was placed in CDU on by Earley Favor, NP. Patient care resumed from Marlon Pel, [place].  Patient is here for Ortho evaluation. While in obeservation over night the pt slept well and had no complaints, per nursing staff. Patient re-evaluated and is resting comfortable, VSS, with no new complaints or concerns at this time. Plan per previous provider is to have Dr. Valentina Gu, Orthopedic Surgeon, see the patient. On exam: hemodynamically stable, NAD, heart w/ RRR, lungs CTAB, Chest & abd non-tender, no peripheral edema or calf tenderness. Right knee swelling and tenderness to palpation.    Dr. Valentina Gu recommended to order an ultrasound to rule out DVT. If negative, patient can be discharged.   8:31 AM Lower extremity US negative for DVT. Patient can be discharged home with instructions to return with worsening or concerning symptoms.   Emilia Beck, PA-C 12/31/11 1236

## 2011-12-31 NOTE — ED Notes (Signed)
Patient transported to X-ray 

## 2012-01-01 NOTE — ED Provider Notes (Signed)
Medical screening examination/treatment/procedure(s) were performed by non-physician practitioner and as supervising physician I was immediately available for consultation/collaboration.  Jordann Grime R. Onika Gudiel, MD 01/01/12 1540 

## 2012-01-04 DIAGNOSIS — IMO0001 Reserved for inherently not codable concepts without codable children: Secondary | ICD-10-CM | POA: Diagnosis not present

## 2012-01-04 DIAGNOSIS — IMO0002 Reserved for concepts with insufficient information to code with codable children: Secondary | ICD-10-CM | POA: Diagnosis not present

## 2012-01-04 DIAGNOSIS — Z471 Aftercare following joint replacement surgery: Secondary | ICD-10-CM | POA: Diagnosis not present

## 2012-01-04 DIAGNOSIS — Z7901 Long term (current) use of anticoagulants: Secondary | ICD-10-CM | POA: Diagnosis not present

## 2012-01-04 DIAGNOSIS — Z96659 Presence of unspecified artificial knee joint: Secondary | ICD-10-CM | POA: Diagnosis not present

## 2012-01-06 DIAGNOSIS — Z96659 Presence of unspecified artificial knee joint: Secondary | ICD-10-CM | POA: Diagnosis not present

## 2012-01-06 DIAGNOSIS — IMO0002 Reserved for concepts with insufficient information to code with codable children: Secondary | ICD-10-CM | POA: Diagnosis not present

## 2012-01-06 DIAGNOSIS — Z7901 Long term (current) use of anticoagulants: Secondary | ICD-10-CM | POA: Diagnosis not present

## 2012-01-06 DIAGNOSIS — IMO0001 Reserved for inherently not codable concepts without codable children: Secondary | ICD-10-CM | POA: Diagnosis not present

## 2012-01-06 DIAGNOSIS — Z471 Aftercare following joint replacement surgery: Secondary | ICD-10-CM | POA: Diagnosis not present

## 2012-01-07 DIAGNOSIS — Z96659 Presence of unspecified artificial knee joint: Secondary | ICD-10-CM | POA: Diagnosis not present

## 2012-01-07 DIAGNOSIS — IMO0002 Reserved for concepts with insufficient information to code with codable children: Secondary | ICD-10-CM | POA: Diagnosis not present

## 2012-01-07 DIAGNOSIS — IMO0001 Reserved for inherently not codable concepts without codable children: Secondary | ICD-10-CM | POA: Diagnosis not present

## 2012-01-07 DIAGNOSIS — Z7901 Long term (current) use of anticoagulants: Secondary | ICD-10-CM | POA: Diagnosis not present

## 2012-01-07 DIAGNOSIS — Z471 Aftercare following joint replacement surgery: Secondary | ICD-10-CM | POA: Diagnosis not present

## 2012-01-08 DIAGNOSIS — Z96659 Presence of unspecified artificial knee joint: Secondary | ICD-10-CM | POA: Diagnosis not present

## 2012-01-08 DIAGNOSIS — Z471 Aftercare following joint replacement surgery: Secondary | ICD-10-CM | POA: Diagnosis not present

## 2012-01-08 DIAGNOSIS — Z7901 Long term (current) use of anticoagulants: Secondary | ICD-10-CM | POA: Diagnosis not present

## 2012-01-08 DIAGNOSIS — IMO0002 Reserved for concepts with insufficient information to code with codable children: Secondary | ICD-10-CM | POA: Diagnosis not present

## 2012-01-08 DIAGNOSIS — IMO0001 Reserved for inherently not codable concepts without codable children: Secondary | ICD-10-CM | POA: Diagnosis not present

## 2012-01-09 DIAGNOSIS — IMO0002 Reserved for concepts with insufficient information to code with codable children: Secondary | ICD-10-CM | POA: Diagnosis not present

## 2012-01-09 DIAGNOSIS — IMO0001 Reserved for inherently not codable concepts without codable children: Secondary | ICD-10-CM | POA: Diagnosis not present

## 2012-01-09 DIAGNOSIS — Z471 Aftercare following joint replacement surgery: Secondary | ICD-10-CM | POA: Diagnosis not present

## 2012-01-09 DIAGNOSIS — Z7901 Long term (current) use of anticoagulants: Secondary | ICD-10-CM | POA: Diagnosis not present

## 2012-01-09 DIAGNOSIS — Z96659 Presence of unspecified artificial knee joint: Secondary | ICD-10-CM | POA: Diagnosis not present

## 2012-01-10 DIAGNOSIS — Z7901 Long term (current) use of anticoagulants: Secondary | ICD-10-CM | POA: Diagnosis not present

## 2012-01-10 DIAGNOSIS — Z96659 Presence of unspecified artificial knee joint: Secondary | ICD-10-CM | POA: Diagnosis not present

## 2012-01-10 DIAGNOSIS — IMO0002 Reserved for concepts with insufficient information to code with codable children: Secondary | ICD-10-CM | POA: Diagnosis not present

## 2012-01-10 DIAGNOSIS — IMO0001 Reserved for inherently not codable concepts without codable children: Secondary | ICD-10-CM | POA: Diagnosis not present

## 2012-01-10 DIAGNOSIS — Z471 Aftercare following joint replacement surgery: Secondary | ICD-10-CM | POA: Diagnosis not present

## 2012-01-13 DIAGNOSIS — IMO0001 Reserved for inherently not codable concepts without codable children: Secondary | ICD-10-CM | POA: Diagnosis not present

## 2012-01-13 DIAGNOSIS — IMO0002 Reserved for concepts with insufficient information to code with codable children: Secondary | ICD-10-CM | POA: Diagnosis not present

## 2012-01-13 DIAGNOSIS — Z96659 Presence of unspecified artificial knee joint: Secondary | ICD-10-CM | POA: Diagnosis not present

## 2012-01-13 DIAGNOSIS — Z471 Aftercare following joint replacement surgery: Secondary | ICD-10-CM | POA: Diagnosis not present

## 2012-01-13 DIAGNOSIS — Z7901 Long term (current) use of anticoagulants: Secondary | ICD-10-CM | POA: Diagnosis not present

## 2012-01-15 DIAGNOSIS — Z471 Aftercare following joint replacement surgery: Secondary | ICD-10-CM | POA: Diagnosis not present

## 2012-01-15 DIAGNOSIS — IMO0001 Reserved for inherently not codable concepts without codable children: Secondary | ICD-10-CM | POA: Diagnosis not present

## 2012-01-15 DIAGNOSIS — Z96659 Presence of unspecified artificial knee joint: Secondary | ICD-10-CM | POA: Diagnosis not present

## 2012-01-15 DIAGNOSIS — Z7901 Long term (current) use of anticoagulants: Secondary | ICD-10-CM | POA: Diagnosis not present

## 2012-01-15 DIAGNOSIS — IMO0002 Reserved for concepts with insufficient information to code with codable children: Secondary | ICD-10-CM | POA: Diagnosis not present

## 2012-01-17 DIAGNOSIS — IMO0002 Reserved for concepts with insufficient information to code with codable children: Secondary | ICD-10-CM | POA: Diagnosis not present

## 2012-01-17 DIAGNOSIS — Z471 Aftercare following joint replacement surgery: Secondary | ICD-10-CM | POA: Diagnosis not present

## 2012-01-17 DIAGNOSIS — Z7901 Long term (current) use of anticoagulants: Secondary | ICD-10-CM | POA: Diagnosis not present

## 2012-01-17 DIAGNOSIS — IMO0001 Reserved for inherently not codable concepts without codable children: Secondary | ICD-10-CM | POA: Diagnosis not present

## 2012-01-17 DIAGNOSIS — Z96659 Presence of unspecified artificial knee joint: Secondary | ICD-10-CM | POA: Diagnosis not present

## 2012-01-20 DIAGNOSIS — M25569 Pain in unspecified knee: Secondary | ICD-10-CM | POA: Diagnosis not present

## 2012-01-20 DIAGNOSIS — M1710 Unilateral primary osteoarthritis, unspecified knee: Secondary | ICD-10-CM | POA: Diagnosis not present

## 2012-01-20 DIAGNOSIS — Z96659 Presence of unspecified artificial knee joint: Secondary | ICD-10-CM | POA: Diagnosis not present

## 2012-01-22 DIAGNOSIS — Z96659 Presence of unspecified artificial knee joint: Secondary | ICD-10-CM | POA: Diagnosis not present

## 2012-01-22 DIAGNOSIS — M25569 Pain in unspecified knee: Secondary | ICD-10-CM | POA: Diagnosis not present

## 2012-01-22 DIAGNOSIS — M1710 Unilateral primary osteoarthritis, unspecified knee: Secondary | ICD-10-CM | POA: Diagnosis not present

## 2012-01-27 DIAGNOSIS — M25569 Pain in unspecified knee: Secondary | ICD-10-CM | POA: Diagnosis not present

## 2012-01-27 DIAGNOSIS — Z96659 Presence of unspecified artificial knee joint: Secondary | ICD-10-CM | POA: Diagnosis not present

## 2012-01-27 DIAGNOSIS — M1710 Unilateral primary osteoarthritis, unspecified knee: Secondary | ICD-10-CM | POA: Diagnosis not present

## 2012-01-29 DIAGNOSIS — Z96659 Presence of unspecified artificial knee joint: Secondary | ICD-10-CM | POA: Diagnosis not present

## 2012-01-29 DIAGNOSIS — M25569 Pain in unspecified knee: Secondary | ICD-10-CM | POA: Diagnosis not present

## 2012-01-29 DIAGNOSIS — M1710 Unilateral primary osteoarthritis, unspecified knee: Secondary | ICD-10-CM | POA: Diagnosis not present

## 2012-02-03 DIAGNOSIS — M1710 Unilateral primary osteoarthritis, unspecified knee: Secondary | ICD-10-CM | POA: Diagnosis not present

## 2012-02-03 DIAGNOSIS — M25569 Pain in unspecified knee: Secondary | ICD-10-CM | POA: Diagnosis not present

## 2012-02-03 DIAGNOSIS — Z96659 Presence of unspecified artificial knee joint: Secondary | ICD-10-CM | POA: Diagnosis not present

## 2012-02-05 DIAGNOSIS — M25569 Pain in unspecified knee: Secondary | ICD-10-CM | POA: Diagnosis not present

## 2012-02-05 DIAGNOSIS — Z96659 Presence of unspecified artificial knee joint: Secondary | ICD-10-CM | POA: Diagnosis not present

## 2012-02-05 DIAGNOSIS — M1710 Unilateral primary osteoarthritis, unspecified knee: Secondary | ICD-10-CM | POA: Diagnosis not present

## 2012-02-11 DIAGNOSIS — R972 Elevated prostate specific antigen [PSA]: Secondary | ICD-10-CM | POA: Diagnosis not present

## 2012-02-11 DIAGNOSIS — Z96659 Presence of unspecified artificial knee joint: Secondary | ICD-10-CM | POA: Diagnosis not present

## 2012-02-11 DIAGNOSIS — M25569 Pain in unspecified knee: Secondary | ICD-10-CM | POA: Diagnosis not present

## 2012-02-11 DIAGNOSIS — M1710 Unilateral primary osteoarthritis, unspecified knee: Secondary | ICD-10-CM | POA: Diagnosis not present

## 2012-02-11 DIAGNOSIS — N400 Enlarged prostate without lower urinary tract symptoms: Secondary | ICD-10-CM | POA: Diagnosis not present

## 2012-02-11 DIAGNOSIS — R310 Gross hematuria: Secondary | ICD-10-CM | POA: Diagnosis not present

## 2012-02-12 DIAGNOSIS — R310 Gross hematuria: Secondary | ICD-10-CM | POA: Diagnosis not present

## 2012-02-12 DIAGNOSIS — N400 Enlarged prostate without lower urinary tract symptoms: Secondary | ICD-10-CM | POA: Diagnosis not present

## 2012-02-12 DIAGNOSIS — R319 Hematuria, unspecified: Secondary | ICD-10-CM | POA: Diagnosis not present

## 2012-02-13 DIAGNOSIS — Z96659 Presence of unspecified artificial knee joint: Secondary | ICD-10-CM | POA: Diagnosis not present

## 2012-02-13 DIAGNOSIS — M25569 Pain in unspecified knee: Secondary | ICD-10-CM | POA: Diagnosis not present

## 2012-02-13 DIAGNOSIS — M1710 Unilateral primary osteoarthritis, unspecified knee: Secondary | ICD-10-CM | POA: Diagnosis not present

## 2012-02-27 DIAGNOSIS — R310 Gross hematuria: Secondary | ICD-10-CM | POA: Diagnosis not present

## 2012-06-12 ENCOUNTER — Telehealth: Payer: Self-pay | Admitting: Pulmonary Disease

## 2012-06-12 MED ORDER — ROSUVASTATIN CALCIUM 20 MG PO TABS: 20.0000 mg | ORAL_TABLET | Freq: Every day | ORAL | Status: DC

## 2012-06-12 MED ORDER — OMEPRAZOLE 20 MG PO CPDR: 20.0000 mg | DELAYED_RELEASE_CAPSULE | Freq: Every day | ORAL | Status: DC

## 2012-06-12 NOTE — Telephone Encounter (Signed)
Called and spoke with pt and he is requesting refill of the crestor for #30 day supply and omeprazole #90 day supply.  Called and spoke with pt and he is aware of orders that has been sent to his pharmacy.

## 2012-10-14 DIAGNOSIS — R972 Elevated prostate specific antigen [PSA]: Secondary | ICD-10-CM | POA: Diagnosis not present

## 2012-10-21 DIAGNOSIS — R972 Elevated prostate specific antigen [PSA]: Secondary | ICD-10-CM | POA: Diagnosis not present

## 2012-10-21 DIAGNOSIS — R310 Gross hematuria: Secondary | ICD-10-CM | POA: Diagnosis not present

## 2012-10-21 DIAGNOSIS — N400 Enlarged prostate without lower urinary tract symptoms: Secondary | ICD-10-CM | POA: Diagnosis not present

## 2013-01-12 ENCOUNTER — Telehealth: Payer: Self-pay | Admitting: Pulmonary Disease

## 2013-01-12 MED ORDER — ROSUVASTATIN CALCIUM 20 MG PO TABS: 20.0000 mg | ORAL_TABLET | Freq: Every day | ORAL | Status: DC

## 2013-01-12 MED ORDER — OMEPRAZOLE 20 MG PO CPDR: 20.0000 mg | DELAYED_RELEASE_CAPSULE | Freq: Every day | ORAL | Status: DC

## 2013-01-12 NOTE — Telephone Encounter (Signed)
Called and spoke with pt and he is aware that SN will be retiring from primary care.  Pt scheduled appt with SN on 1/30.  Pt is aware of refills that have been sent to the pharmacy.

## 2013-02-05 ENCOUNTER — Encounter: Payer: Self-pay | Admitting: Pulmonary Disease

## 2013-02-05 ENCOUNTER — Ambulatory Visit (INDEPENDENT_AMBULATORY_CARE_PROVIDER_SITE_OTHER): Payer: Medicare Other | Admitting: Pulmonary Disease

## 2013-02-05 VITALS — BP 142/86 | HR 60 | Temp 97.9°F | Ht 72.0 in | Wt 195.4 lb

## 2013-02-05 DIAGNOSIS — K449 Diaphragmatic hernia without obstruction or gangrene: Secondary | ICD-10-CM | POA: Diagnosis not present

## 2013-02-05 DIAGNOSIS — M5030 Other cervical disc degeneration, unspecified cervical region: Secondary | ICD-10-CM

## 2013-02-05 DIAGNOSIS — I4891 Unspecified atrial fibrillation: Secondary | ICD-10-CM | POA: Diagnosis not present

## 2013-02-05 DIAGNOSIS — Z87898 Personal history of other specified conditions: Secondary | ICD-10-CM

## 2013-02-05 DIAGNOSIS — R972 Elevated prostate specific antigen [PSA]: Secondary | ICD-10-CM

## 2013-02-05 DIAGNOSIS — K219 Gastro-esophageal reflux disease without esophagitis: Secondary | ICD-10-CM

## 2013-02-05 DIAGNOSIS — E7800 Pure hypercholesterolemia, unspecified: Secondary | ICD-10-CM

## 2013-02-05 DIAGNOSIS — D126 Benign neoplasm of colon, unspecified: Secondary | ICD-10-CM

## 2013-02-05 DIAGNOSIS — M1990 Unspecified osteoarthritis, unspecified site: Secondary | ICD-10-CM

## 2013-02-05 DIAGNOSIS — K5730 Diverticulosis of large intestine without perforation or abscess without bleeding: Secondary | ICD-10-CM

## 2013-02-05 DIAGNOSIS — I480 Paroxysmal atrial fibrillation: Secondary | ICD-10-CM

## 2013-02-05 IMAGING — CR DG CHEST 2V
1 series · 1 of 1 positions shown · non-contrast
Comparison: Chest radiograph performed 11/20/2011

CLINICAL DATA: Fever after knee surgery; drainage and erythema at
the surgical site.

CHEST - 2 VIEW

[view not recorded]
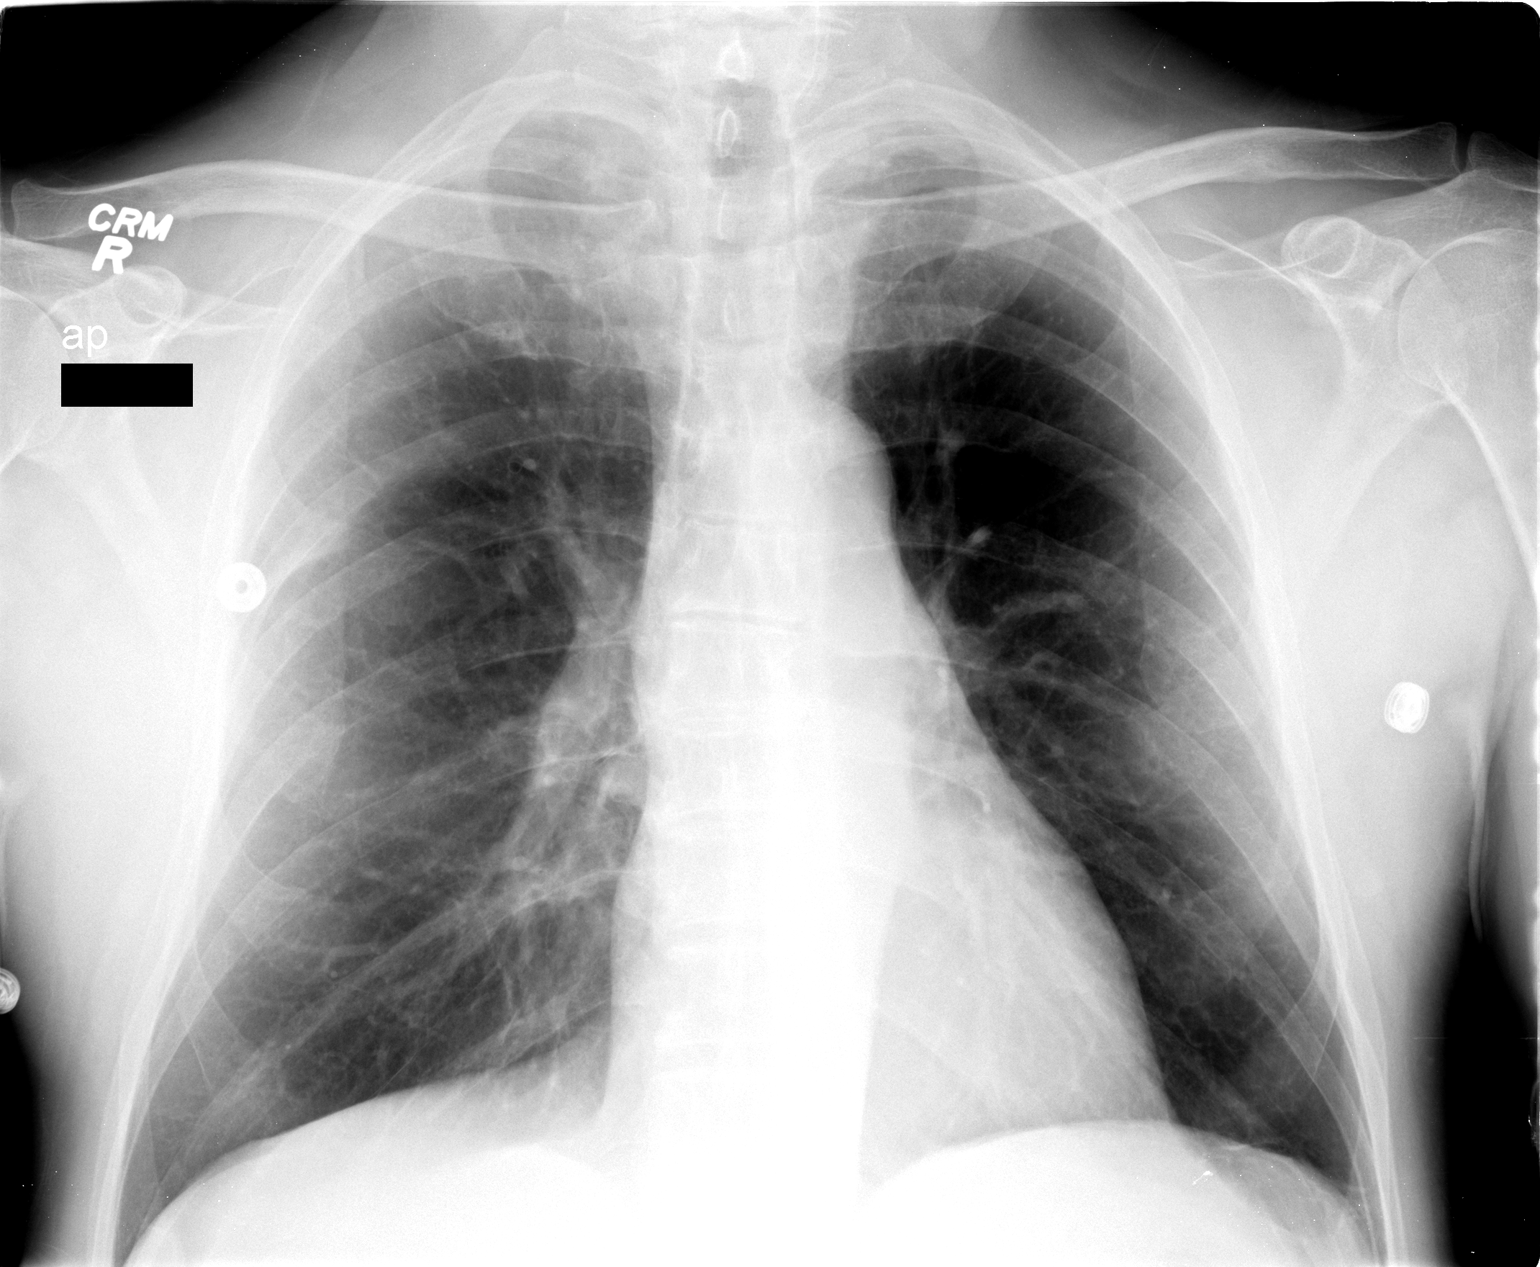

[1 of 1 positions shown; findings below may reference images not displayed]

FINDINGS: The lungs are well-aerated and clear.  There is no
evidence of focal opacification, pleural effusion or pneumothorax.
Minimal density at the peripheral right midlung zone is thought to
reflect chronic atelectasis or scarring, given stability from
multiple prior studies.

The heart is normal in size; the mediastinal contour is within
normal limits.  No acute osseous abnormalities are seen.
IMPRESSION: No acute cardiopulmonary process seen.

## 2013-02-05 IMAGING — CR DG KNEE COMPLETE 4+V*R*
4 series · 4 of 4 positions shown · non-contrast
Comparison: None.

CLINICAL DATA: Status post right total knee arthroplasty, with
erythema and drainage at the surgical site.

RIGHT KNEE - COMPLETE 4+ VIEW

[t knee obl right (1 of 2)]
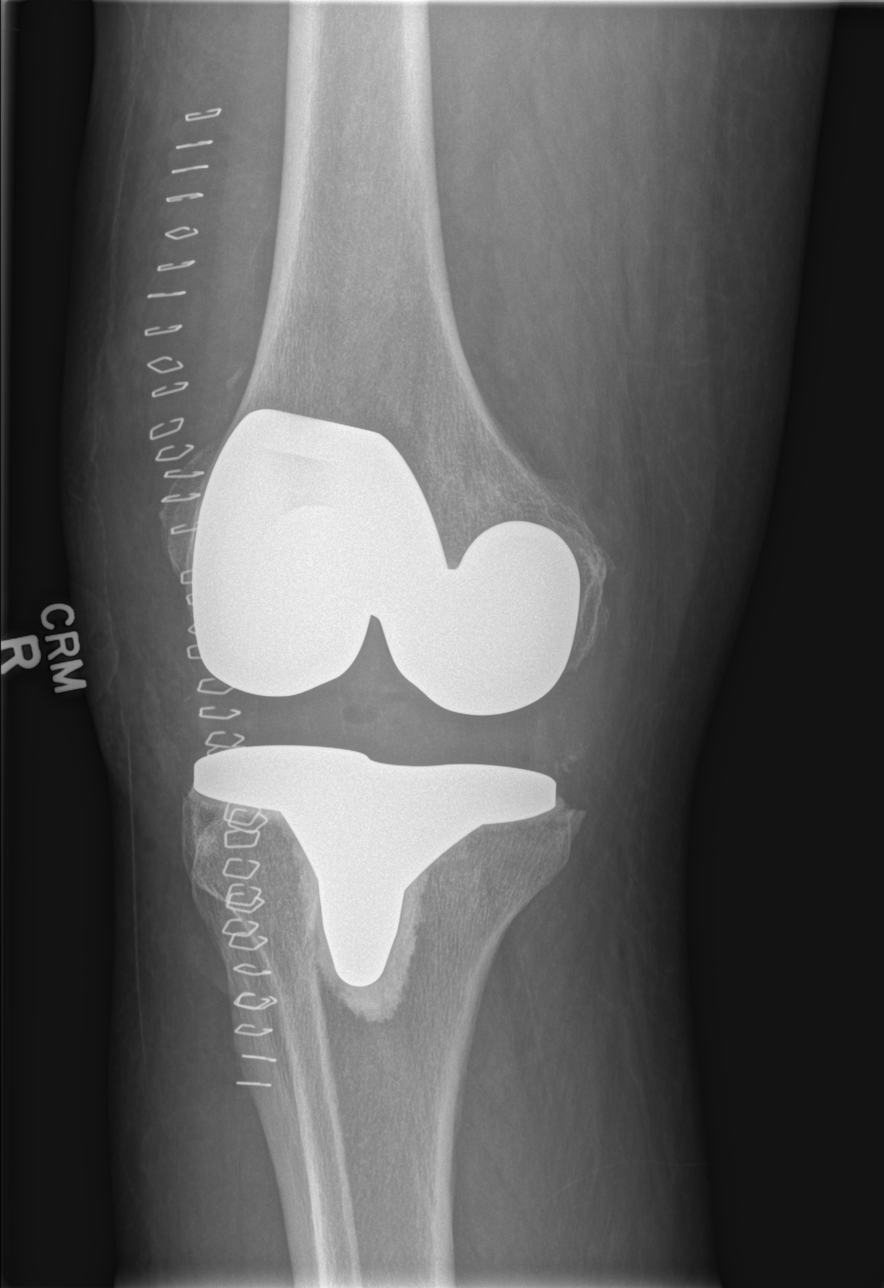

[t knee ap right]
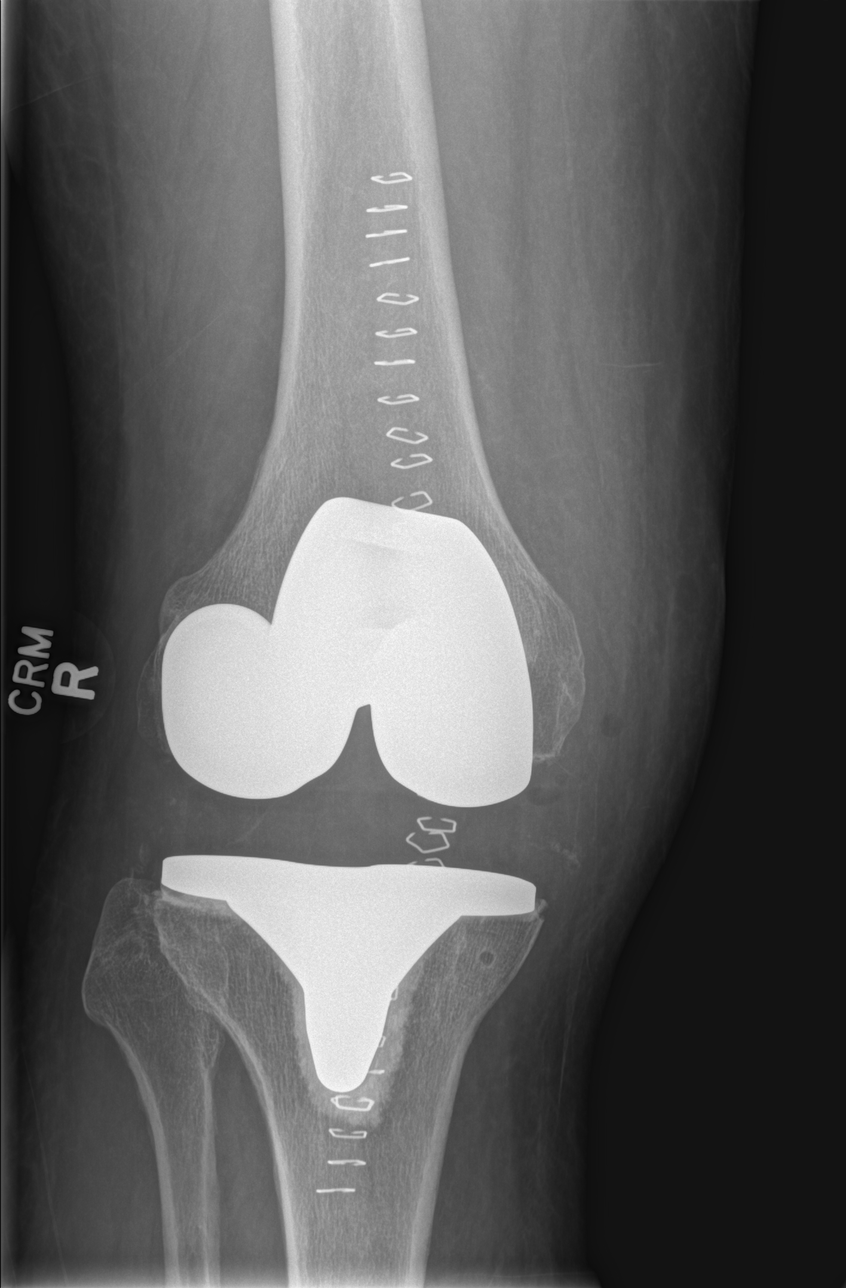

[t knee obl right (2 of 2)]
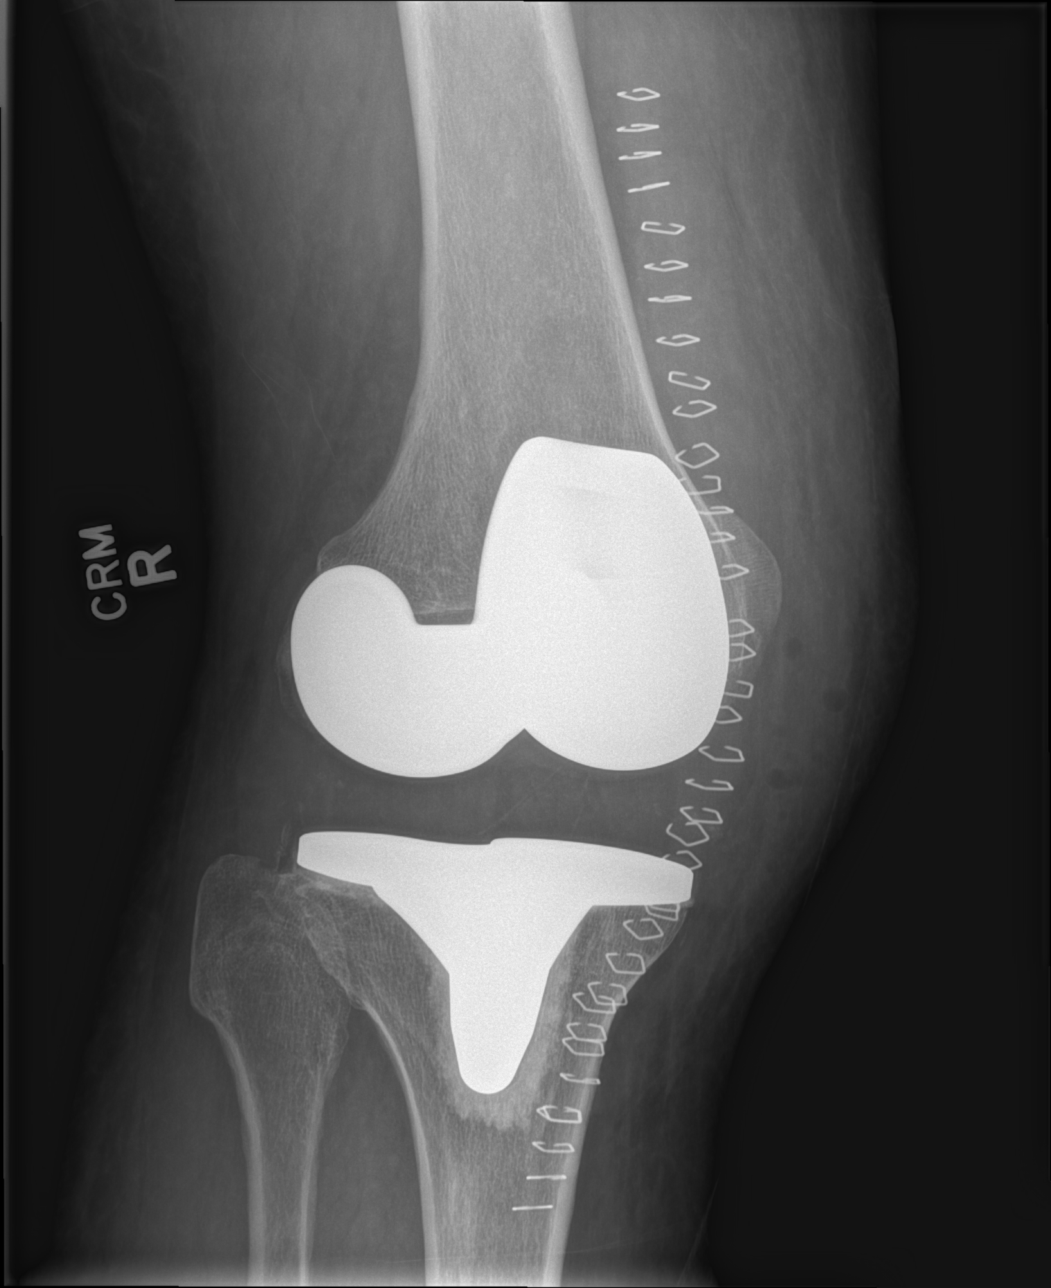

[x knee lat right]
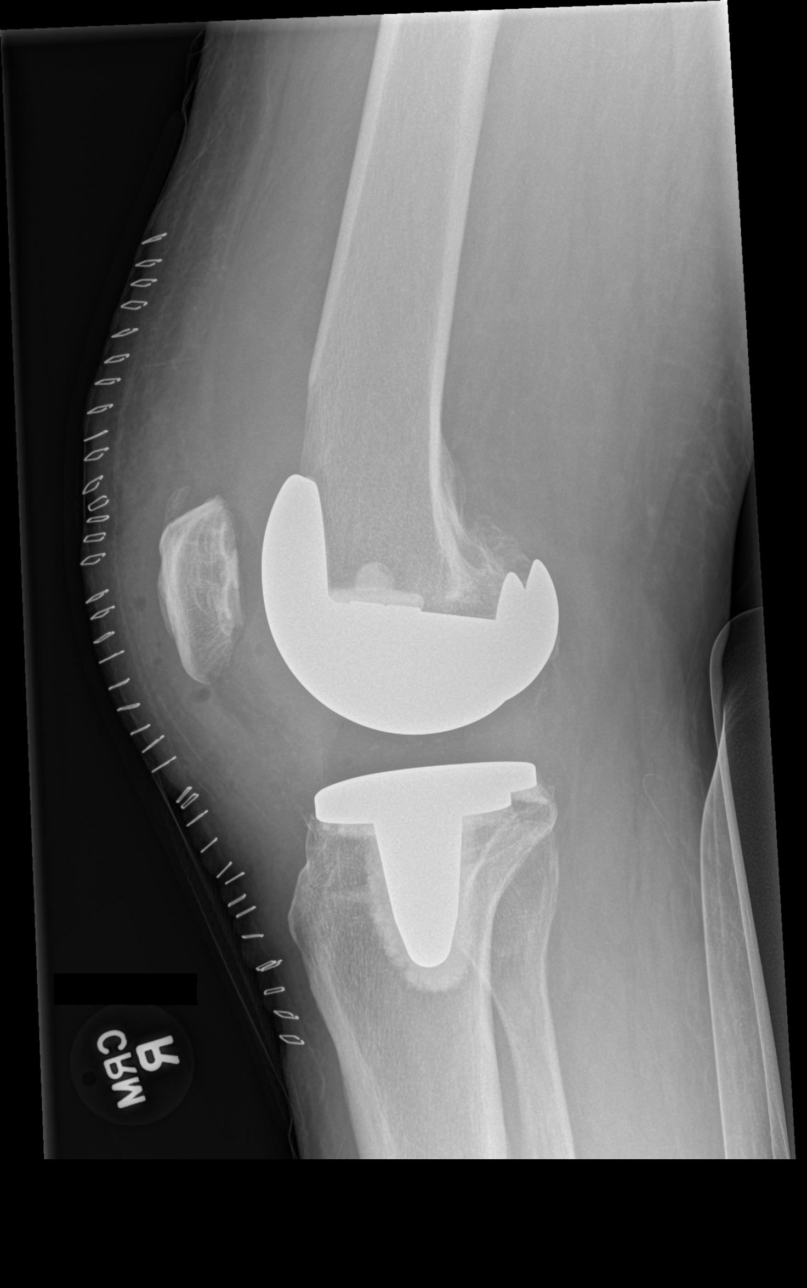

[4 of 4 positions shown; findings below may reference images not displayed]

FINDINGS: The right total knee arthroplasty appears grossly intact,
without evidence of loosening or fracture.  Scattered soft tissue
air is noted in the prepatellar soft tissues, with associated soft
tissue swelling extending inferiorly.  A small knee joint effusion
is noted.  Overlying skin staples are seen.  There is no definite
evidence of osseous erosion at this time.
IMPRESSION: 1.  Right total knee arthroplasty appears grossly intact, without
evidence of loosening or fracture.
2.  Soft tissue swelling involving the prepatellar and
infrapatellar soft tissues; scattered soft tissue air noted in the
prepatellar soft tissues.
3.  Small knee joint effusion noted.

## 2013-02-05 NOTE — Patient Instructions (Signed)
Today we updated your med list in our EPIC system...    Continue your current medications the same...  Please return to our lab one morning next week for your FASTING blood work... We will contact you w/ the results when available...   Call for any questions...  Let's plan a follow up visit in 80yr, sooner if needed for problems.[name] KitchenMarland Kitchen

## 2013-02-07 ENCOUNTER — Encounter: Payer: Self-pay | Admitting: Pulmonary Disease

## 2013-02-07 NOTE — Progress Notes (Addendum)
Subjective:    Patient ID: Thomas Pearson, male    DOB: 1940-06-24, 73 y.o.   MRN: [id number]  HPI 73 y/o WM here for a follow up visit>>  ~  November 01, 2010:  1moROV & he needs medical clearance for left TKR- had recent left knee injury w/ hemarthrosis, eval by DrDeveshwar & Caffrey "It's bone-on-bone" he says;  He had episode abd pain & went to ER 9/12> eval revealed lower abd tenderness, norm labs, CT w/ ?poss early diverticulitis, given cipro/ flagyl/ vicodin; he had f/u appt DrKaplan & symptoms had resolved> Colonoscopy 09/27/10 showed divertics, & several polyps= tubular adenoma & f/u planned 568yr     <Hx PAF> on ASA816maily; BP is normal & he denies CP, palpit, dizzy, SOB, edema, etc...    <Chol> on Cres20 & he is not fasting for FLP today- see below, & he will ret for FLP on the Crestor20...    <HH/ Esophagitis> on Prilosec 75m36m he denies upper abd pain, N/ V, dysphagia, etc...    <Divertics, Polyps> on stool softener daily; had bout of diverticulitis 9/12 as above- resolved w/ cipro/ flagyl; subseq f/u DrKaplan w/ Colonoscopy 9/12 revealing divertics, one polyp removed= tubular adenoma & repeat planned 158yrs51yr   <Hx pancreatitis> no recurrence since his only bout in 2009; he knows to avoid Etoh etc...    <BPH, elev PSA> he had f/u DrBorden 8/12- s/p TURP 2000 for BPH, PSA 4.34 in 2006 w/ neg Bx; PSA 8/11 was 7.03 w/ repeat Bx also neg for malig; PSA by DrBorden was 5.70 (%free=24, risk of Ca in this 73y/o is 15-30%); they plan f/u in 6 months...    <DJD/ DDD> followed by DrDevTheophilus Kindsdo not have any recent notes from them; Pt states his left knee is bone-on-bone & needs TKR...  ~  February 07, 2011:  72mo R68mo [facility] by DrCaffrey & he did well in surg, in rehab, on coumadin, etc; now off the coumadin & getting about well, he is making plans for the other knee later this yr;  Then [facility]/ bilat lower abd pain &  felt to have mild diverticulitis> CT Abd showed segmental wall thickening in sigmoid, no signif soft tissue inflamm;  LABS showed Hg>11==>9, WBC=9==>5, Chems=wnl, CDiff=neg, UA=clear;  He was treated w/ Cipro/ Flagyl & improved;  Since disch he is about back to baseline, eating normally, etc... NOTE> he had similar mild episode 9/12 that responded similarly; prev colonoscopy 9/12 showed divertics, one polyp removed, f/u planned 158yrs..58yr  May 07, 2011:  72mo ROV92moRichard has had a stable interval- no new complaints or concerns; he has recovered from his left TKR12/12 by DrCaffrey;  Similarly he has not had any recurrent divertic symptoms- no pain, no n/v etc; he notes his breathing is good & he denies CP, palpit, dizzy, SOB, edema, etc; he is planning right TKR 12/13...  See prob list below>> LABS 4/13:  FLP- at goals on Cres20;  Chems- ok;  CBC- wnl w/ Hg=14.3;  TSH=1.24;  PSA=6.73...  ~  November 20, 2011:  6-29mo ROV 374mochard iArtemusfor a pre-op medical clearance prior to his proposed Right TKR by DrCaffrey sched for 12/27/11...     <Hx PAF> on ASA81mg dail31mP is normal & he denies CP, palpit, dizzy, SOB, edema, etc...    <Chol> on Cres20 & last FLP 4/13  showed TChol 157, TG 82, HDL 63, LDL 77...    <HH/ Esophagitis> on Prilosec 38m/d; he denies upper abd pain, N/ V, dysphagia, etc...    <Divertics, Polyps> on stool softener daily; had bout of diverticulitis 9/12- resolved w/ cipro/ flagyl; subseq f/u DrKaplan w/ Colonoscopy 9/12 revealing divertics, one polyp removed= tubular adenoma & repeat planned 554yr..    <Hx pancreatitis> no recurrence since his only bout in 2009; he knows to avoid Etoh etc...    <BPH, elev PSA> he had f/u DrBorden 8/12- s/p TURP 2000 for BPH, PSA=4.34 in 2006 w/ neg Bx; PSA 8/11 was 7.03 w/ repeat Bx also neg for malig; PSA by DrBorden was 5.70 (%free=24, risk of Ca in this 73y/o is 15-30%); they plan f/u Q6 months...    <DJD/ DDD> followed by DrTheophilus Kinds we do not have any recent notes from them; he had left TKR 12/12 & right TKR 12/13...  We reviewed prob list, meds, xrays and labs> see below for updates >> OK Flu shot today... CXR 11/13 showed normal heart size, clear lungs, mild thoracic spondy, NAD...[name] KitchenMarland KitchenKG 11/13 showed SBrady, rate56, WNL, NAD... Labs 4/13 reviewed & pre-op labs to be done prior to the actual surg 12/13...   ~  February 05, 2013:  143moV & Deangelo had his right TKR after his last visit & did satis after his rehab; Currently reports feeling well, on few meds, no new complaints or concerns; He is not fasting today for his yearly fasting blood work & will ret next week; We reviewed the following medical problems during today's office visit >>     <Hx PAF> on ASA81m56mily; BP is normal on diet alone & reminded to elim sodium; he denies CP, palpit, dizzy, SOB, edema, etc...    <Chol> on Cres20 & last FLP 4/13 showed TChol 157, TG 82, HDL 63, LDL 77... He will ret next week for FASTING blood work...    <HH/ Esophagitis> on Prilosec 20mg5mhe denies upper abd pain, N/ V, dysphagia, etc...    <Divertics, Polyps> on stool softener daily; had bout of diverticulitis 9/12 as above- resolved w/ cipro/ flagyl; subseq f/u DrKaplan w/ Colonoscopy 9/12 revealing divertics, one polyp removed= tubular adenoma & repeat planned 70yrs.88yr  <Hx pancreatitis> no recurrence since his only bout in 2009; he knows to avoid Etoh etc...    <BPH, elev PSA> he had f/u DrBorden 8/12- s/p TURP 2000 for BPH, PSA=4.34 in 2006 w/ neg Bx; PSA 8/11 was 7.03 w/ repeat Bx also neg for malig; PSA by DrBorden was 5.70 (%free=24, risk of Ca in this 73y/o is 15-30%); they plan f/u Q6month30month <DJD/ DDD> followed by DrDevesTheophilus Kinds not have any recent notes from them; he had left TKR 12/12 & right TKR 12/13...     <Anemia> he was anemic after his TKR and due for f/u labs to be sure he is back to baseline... We reviewed prob list, meds, xrays and labs> see  below for updates >> Given 2014 Flu vaccine today & reminded to get the yearly Flu shot early each season; Meds refuilled per request...  Last CXR was 12/13> norm heart size, clear lungs min scarring on right, NAD... ADDENDUM 02/11/13>> LABS showed FLP at goals on Cres20;  Chems- wnl x BS=116 (diet rx);  CBC- wnl;  TSH=1.75...           Problem List:     ALLERGIC RHINITIS (ICD-477.9) -  he uses OTC antihist Prn + Nasal Saline...  ABNORMAL ELECTROCARDIOGRAM (ICD-794.31) PAROXYSMAL AFIB >> he he transient AFib reported in the ER w/ his acute pancreatitis 6/09> self limited & ret to NSR... otherw EKG's have been normal x PAC's noted intermittently & he is asymptomatic- denies CP, palpit, dizzy, etc... ~  baseline EKGs showed NSR, WNL... ~  Myoview 7/06 showed occas PAC/ PVC, hypertensive response, good exerc tolerance, diaph attenuation w/o definte scar, no ischemia... ~  EKG 6/11 showed NSR w/ PAC, otherw WNL/ NAD.[name] Kitchen. ~  CXR 6/11 showed normal heart size, clear lungs, sl hyperinflation, NAD... ~  EKG 10/12 showed NSR, rate62, otherw WNL/ NAD.[name] Kitchen. ~  CXR 11/13 showed normal heart size, clear lungs, mild thoracic spondy, NAD.[name] Kitchen ~  EKG 11/13 showed SBrady, rate56, WNL, NAD...  HYPERCHOLESTEROLEMIA (ICD-272.0) - hx hypercholesterolemia prev on Simva80 but this was stopped 6/09 hosp for pancreatitis & pt never followed up... rec to start CRESTOR 46m/d + diet Rx... ~  [place] on diet alone showed TChol 304, TG 130, HDL 53, LDL 225... rec start Simva40. ~  [place]9/04 on Simva40 showed TChol 205, Tg 103, HDL 50, LDL 135... rec incr to Simva80. ~  F[place]/06 on diet alone showed TChol 283, TG 111, HDL 48, LDL 209... rec restart Simva80. ~  [place] on Simva80 showed TChol 151, TG 68, HDL 50, LDL 87 ~  FLP 3/11 by insurance co on diet alone showed TChol 263, TG 118, HDL 61, LDL 178 ~  6/11:  rec to start CRESTOR 245md + low chol/ low fat diet... ~  Pt never ret for follow up FLP on the Cres20... ~  [place]13  on Cres20 showed TChol 157, TG 82, HDL 63, LDL 77... Continue same. ~  FLP 1/15> pending ret to our lab...  HIATAL HERNIA (ICD-553.3) & ESOPHAGITIS, REFLUX (ICD-530.11) - on OMEPRAZOLE 2031m regularly...  ~  [facility], gastritis, & treated w/ Nexium 64m23m.. ~  EGD 6/09 by DrKaplan showed 2cmHDenhamesoph stricture...  DIVERTICULOSIS, COLON (ICD-562.10), & COLONIC POLYPS (ICD-211.3) ~  [facility], diminutive colon polyps, hems... ~  9/12: ER visit w/ abd pain- CTAbd w/ ?poss mild diverticulitis, treated w/ Cipro/ Flagyl & improved... ~  Subseq f/u eval by DrKaDemetra Shinercolonoscopy 9/12 revealing divertics & one polyp= tubular adenoma & repeat request in 30yrs18yr 1/13: another bout of mild diverticulitis similar to 9/12 & similarly responded to Cipro, Flagyl, etc... ~  CTAbd&Pelvis 1/13 showed segmental wall thickening in prox sigmoid & prox transverse colon- ?mild diverticulitis; mult left renal cysts, min Aortic calcif, enlarged prostate, disc space narrowing in lower lumbar area... ~  4/13:  All symptoms have resolved & he denies abd pain, etc...  Hx of ACUTE PANCREATITIS (ICD-577.0) - see 6/09 hospitalization and f/u by DrKaplan... ~  No known recurrence of the pancreatitis, GB work up was neg,?of relation to lipids, he knows to avoid Etoh...  BENIGN PROSTATIC HYPERTROPHY, HX OF (ICD-V13.8)   ELEVATED PROSTATE SPECIFIC ANTIGEN (ICD-790.93) - long hx elevated PSA's w/ TURP 9/00 by DrRDaIPJASNKNign tissue... follow up yearly PSA's in the 4-5 range and prostate bx 9/06 was also benign...  ~  labs 3/11 by insurance co showed PSA= 5.52 ~  labs 6/11 here showed PSA= 6.30 & [facility]... ~  Eval by DrBorden w/ repeat Bx 8/11- all benign, & they continue to follow his PSAs  every 9mo.. ~  Labs 4/13 here showed PSA= 6.73 & he continues regular f/u w/ Urology... ~  Urology f/u 10/14 by DrBorden> BPH, Hx elev  PSA w/ neg bx in 2006 & 2011, hx hematuria 2013 w/ neg eval; doing satis, PSA=6.95, f/u planned 17yr.  DEGENERATIVE JOINT DISEASE (ICD-715.90)  DEGENERATIVE DISC DISEASE, CERVICAL SPINE (ICD-722.4) - followed by DrDeveshwar for Rheum w/ signif DJD knees- s/p Synvisc shots & he's been told he'll need TKR's...  also hx chronic neck pain & DDD> he uses Tylenol, heat, etc... ~  12/12:  S/p left TKR by drCaffrey; he did well post op w/ rehab etc... ~  11/13:  Here for pre-op check & med clearance in advance of his planned right TKR 12/13=> done & recovered after rehab...   Past Surgical History  Procedure Laterality Date  . Prostate surgery    . Total knee arthroplasty  12/28/2010    Procedure: TOTAL KNEE ARTHROPLASTY;  Surgeon: W Yvette Rack MD;  [facility];  Laterality: Left;  left total knee - [id number]. Joint replacement    . Transurethral resection of prostate    . Total knee arthroplasty  12/27/2011    Procedure: TOTAL KNEE ARTHROPLASTY;  Surgeon: W Yvette Rack MD;  [facility];  Laterality: Right;  Right Knee Arthroplasty/Knee Medial and Lateral Compartments with Patella Resurfacing    Outpatient Encounter Prescriptions as of 02/05/2013  Medication Sig  . omeprazole (PRILOSEC) 20 MG capsule Take 1 capsule (20 mg total) by mouth daily.  . rosuvastatin (CRESTOR) 20 MG tablet Take 1 tablet (20 mg total) by mouth at bedtime.  . [DISCONTINUED] docusate sodium (COLACE) 100 MG capsule Take 100 mg by mouth 2 (two) times daily.  . [DISCONTINUED] enoxaparin (LOVENOX) 30 MG/0.3ML injection Inject 0.3 mLs (30 mg total) into the skin every 12 (twelve) hours.  . [DISCONTINUED] HYDROcodone-acetaminophen (NORCO) 7.5-325 MG per tablet Take 1 tablet by mouth every 6 (six) hours as needed. For pain  . [DISCONTINUED] HYDROcodone-acetaminophen (NORCO/VICODIN) 5-325 MG per tablet Take 2 tablets by mouth every 4 (four) hours as needed for pain.  .  [DISCONTINUED] methocarbamol (ROBAXIN) 500 MG tablet Take 1 tablet (500 mg total) by mouth 4 (four) times daily as needed. Muscle spasm    Allergies  Allergen Reactions  . Oxycodone-Acetaminophen Hives    Current Medications, Allergies, Past Medical History, Past Surgical History, Family History, and Social History were reviewed in [employer].    Review of Systems       The patient complains of joint pain, stiffness, and arthritis.  The patient denies fever, chills, sweats, anorexia, fatigue, weakness, malaise, weight loss, sleep disorder, blurring, diplopia, eye irritation, eye discharge, vision loss, eye pain, photophobia, earache, ear discharge, tinnitus, decreased hearing, nasal congestion, nosebleeds, sore throat, hoarseness, chest pain, palpitations, syncope, dyspnea on exertion, orthopnea, PND, peripheral edema, cough, dyspnea at rest, excessive sputum, hemoptysis, wheezing, pleurisy, nausea, vomiting, diarrhea, constipation, change in bowel habits, abdominal pain, melena, hematochezia, jaundice, gas/bloating, indigestion/heartburn, dysphagia, odynophagia, dysuria, hematuria, urinary frequency, urinary hesitancy, nocturia, incontinence, back pain, joint swelling, muscle cramps, muscle weakness, sciatica, restless legs, leg pain at night, leg pain with exertion, rash, itching, dryness, suspicious lesions, paralysis, paresthesias, seizures, tremors, vertigo, transient blindness, frequent falls, frequent headaches, difficulty walking, depression, anxiety, memory loss, confusion, cold intolerance, heat intolerance, polydipsia, polyphagia, polyuria, unusual weight change, abnormal bruising, bleeding, enlarged lymph nodes, urticaria, allergic rash, hay fever, and recurrent  infections.    Objective:   Physical Exam     WD, WN, 73 y/o WM in NAD... GENERAL:  Alert & oriented; pleasant & cooperative. HEENT:  [place]/AT, EOM-wnl, PERRLA, Fundi-benign, EACs-clear, TMs-wnl,  NOSE-clear, THROAT-clear & wnl. NECK:  Supple w/ fairROM; no JVD; normal carotid impulses w/o bruits; no thyromegaly or nodules palpated; no lymphadenopathy. CHEST:  Clear to P & A; without wheezes/ rales/ or rhonchi. HEART:  Regular Rhythm; without murmurs/ rubs/ or gallops detected... ABDOMEN:  Soft & nontender; normal bowel sounds; no organomegaly or masses palpated... EXT: without deformities, +arthritic changes; no varicose veins/ venous insuffic/ or edema. NEURO:  CN's intact; motor testing normal; sensory testing normal; gait normal & balance OK. DERM:  No lesions noted; no rash etc...  RADIOLOGY DATA:  Reviewed in the EPIC EMR & discussed w/ the patient...  LABORATORY DATA:  Reviewed in the EPIC EMR & discussed w/ the patient...   Assessment & Plan:    <Hx PAF> on ASA56m daily; BP is normal & he remains asymptomatic; EKGs showed NSR, WNL..[name] Kitchen     <Chol> on Cres20 & FLP is at goals; continue same med + diet...     <HH/ Esophagitis> on Prilosec 232md; he remains asymptomatic...     <Divertics, Polyps> on stool softener daily; had bouts of diverticulitis 9/12 & 1/13 as above- resolved w/ cipro/ flagyl; he sees DrKaplan w/ Colonoscopy 9/12 revealing divertics, one polyp removed= tubular adenoma & repeat planned 5y75yr.     <Hx pancreatitis> no recurrence since his only bout in 2009; he knows to avoid Etoh etc...     <BPH, elev PSA> he had f/u DrBorden 10/14- s/p TURP 2000 for BPH, elev PSAs w/ Bx's on 2 sep occas & both benign; he continues Q6mo77molow up w/ Urology.     <DJD/ DDD> followed by DrDeTheophilus Kindsp left TKR 12/12 & right TKR 12/13...   Patient's Medications  New Prescriptions   No medications on file  Previous Medications   OMEPRAZOLE (PRILOSEC) 20 MG CAPSULE    Take 1 capsule (20 mg total) by mouth daily.   ROSUVASTATIN (CRESTOR) 20 MG TABLET    Take 1 tablet (20 mg total) by mouth at bedtime.  Modified Medications   No medications on file   Discontinued Medications   DOCUSATE SODIUM (COLACE) 100 MG CAPSULE    Take 100 mg by mouth 2 (two) times daily.   ENOXAPARIN (LOVENOX) 30 MG/0.3ML INJECTION    Inject 0.3 mLs (30 mg total) into the skin every 12 (twelve) hours.   HYDROCODONE-ACETAMINOPHEN (NORCO) 7.5-325 MG PER TABLET    Take 1 tablet by mouth every 6 (six) hours as needed. For pain   HYDROCODONE-ACETAMINOPHEN (NORCO/VICODIN) 5-325 MG PER TABLET    Take 2 tablets by mouth every 4 (four) hours as needed for pain.   METHOCARBAMOL (ROBAXIN) 500 MG TABLET    Take 1 tablet (500 mg total) by mouth 4 (four) times daily as needed. Muscle spasm

## 2013-02-09 ENCOUNTER — Telehealth: Payer: Self-pay | Admitting: Pulmonary Disease

## 2013-02-09 MED ORDER — ROSUVASTATIN CALCIUM 20 MG PO TABS: 20.0000 mg | ORAL_TABLET | Freq: Every day | ORAL | Status: DC

## 2013-02-09 MED ORDER — OMEPRAZOLE 20 MG PO CPDR: 20.0000 mg | DELAYED_RELEASE_CAPSULE | Freq: Every day | ORAL | Status: DC

## 2013-02-09 NOTE — Telephone Encounter (Signed)
Pt needed refills on Omeprazole and Crestor. These have been sent to his pharmacy. Nothing further was needed.

## 2013-02-10 ENCOUNTER — Other Ambulatory Visit (INDEPENDENT_AMBULATORY_CARE_PROVIDER_SITE_OTHER): Payer: Medicare Other

## 2013-02-10 DIAGNOSIS — I4891 Unspecified atrial fibrillation: Secondary | ICD-10-CM | POA: Diagnosis not present

## 2013-02-10 DIAGNOSIS — I480 Paroxysmal atrial fibrillation: Secondary | ICD-10-CM

## 2013-02-10 DIAGNOSIS — K449 Diaphragmatic hernia without obstruction or gangrene: Secondary | ICD-10-CM | POA: Diagnosis not present

## 2013-02-10 DIAGNOSIS — E7800 Pure hypercholesterolemia, unspecified: Secondary | ICD-10-CM

## 2013-02-10 DIAGNOSIS — K5730 Diverticulosis of large intestine without perforation or abscess without bleeding: Secondary | ICD-10-CM | POA: Diagnosis not present

## 2013-02-10 LAB — CBC WITH DIFFERENTIAL/PLATELET
Basophils Absolute: 0 10*3/uL (ref 0.0–0.1)
Basophils Relative: 0.4 % (ref 0.0–3.0)
EOS PCT: 3.6 % (ref 0.0–5.0)
Eosinophils Absolute: 0.3 10*3/uL (ref 0.0–0.7)
HCT: 45.9 % (ref 39.0–52.0)
Hemoglobin: 15.1 g/dL (ref 13.0–17.0)
Lymphocytes Relative: 38.1 % (ref 12.0–46.0)
Lymphs Abs: 2.9 10*3/uL (ref 0.7–4.0)
MCHC: 32.9 g/dL (ref 30.0–36.0)
MCV: 88.3 fl (ref 78.0–100.0)
MONOS PCT: 9 % (ref 3.0–12.0)
Monocytes Absolute: 0.7 10*3/uL (ref 0.1–1.0)
NEUTROS PCT: 48.9 % (ref 43.0–77.0)
Neutro Abs: 3.7 10*3/uL (ref 1.4–7.7)
PLATELETS: 221 10*3/uL (ref 150.0–400.0)
RBC: 5.2 Mil/uL (ref 4.22–5.81)
RDW: 14 % (ref 11.5–14.6)
WBC: 7.6 10*3/uL (ref 4.5–10.5)

## 2013-02-10 LAB — LIPID PANEL
CHOLESTEROL: 157 mg/dL (ref 0–200)
HDL: 62.3 mg/dL (ref >39.00)
LDL Cholesterol: 78 mg/dL (ref 0–99)
Total CHOL/HDL Ratio: 3
Triglycerides: 82 mg/dL (ref 0.0–149.0)
VLDL: 16.4 mg/dL (ref 0.0–40.0)

## 2013-02-10 LAB — BASIC METABOLIC PANEL
BUN: 16 mg/dL (ref 6–23)
CHLORIDE: 104 meq/L (ref 96–112)
CO2: 28 mEq/L (ref 19–32)
Calcium: 9.2 mg/dL (ref 8.4–10.5)
Creatinine, Ser: 1.1 mg/dL (ref 0.4–1.5)
GFR: 72.53 mL/min (ref >60.00)
Glucose, Bld: 116 mg/dL — ABNORMAL HIGH (ref 70–99)
POTASSIUM: 4 meq/L (ref 3.5–5.1)
Sodium: 141 mEq/L (ref 135–145)

## 2013-02-10 LAB — HEPATIC FUNCTION PANEL
ALBUMIN: 4 g/dL (ref 3.5–5.2)
ALT: 18 U/L (ref 0–53)
AST: 24 U/L (ref 0–37)
Alkaline Phosphatase: 101 U/L (ref 39–117)
Bilirubin, Direct: 0.1 mg/dL (ref 0.0–0.3)
Total Bilirubin: 0.7 mg/dL (ref 0.3–1.2)
Total Protein: 7.6 g/dL (ref 6.0–8.3)

## 2013-02-10 LAB — TSH: TSH: 1.75 u[IU]/mL (ref 0.35–5.50)

## 2013-02-11 ENCOUNTER — Telehealth: Payer: Self-pay | Admitting: Pulmonary Disease

## 2013-02-11 NOTE — Telephone Encounter (Signed)
Called and spoke with pt and he is aware of lab results per SN. Pt voiced his understanding and nothing further is needed. 

## 2013-03-15 ENCOUNTER — Other Ambulatory Visit: Payer: Self-pay | Admitting: Pulmonary Disease

## 2013-06-23 ENCOUNTER — Telehealth: Payer: Self-pay | Admitting: Pulmonary Disease

## 2013-06-23 MED ORDER — OMEPRAZOLE 20 MG PO CPDR: DELAYED_RELEASE_CAPSULE | ORAL | Status: AC

## 2013-06-23 MED ORDER — ATORVASTATIN CALCIUM 20 MG PO TABS: 20.0000 mg | ORAL_TABLET | Freq: Every day | ORAL | Status: AC

## 2013-06-23 NOTE — Telephone Encounter (Signed)
I called spoke with pt. Aware of recs. RX sent. Nothing further needed

## 2013-06-23 NOTE — Telephone Encounter (Signed)
Called spoke w/ pt. He reports the crestor has ran out. This costs $104 per pt and would like an alternative. Pt has not found new PCP yet. Please advise SN Thanks  Allergies  Allergen Reactions  . Oxycodone-Acetaminophen Hives

## 2013-06-23 NOTE — Telephone Encounter (Signed)
Per SN---  Ok to change to generic lipitor 20 mg  1 po qhs.  thanks

## 2013-10-20 DIAGNOSIS — R972 Elevated prostate specific antigen [PSA]: Secondary | ICD-10-CM | POA: Diagnosis not present

## 2013-10-27 DIAGNOSIS — R972 Elevated prostate specific antigen [PSA]: Secondary | ICD-10-CM | POA: Diagnosis not present

## 2013-10-27 DIAGNOSIS — N400 Enlarged prostate without lower urinary tract symptoms: Secondary | ICD-10-CM | POA: Diagnosis not present

## 2013-12-13 DIAGNOSIS — N400 Enlarged prostate without lower urinary tract symptoms: Secondary | ICD-10-CM | POA: Diagnosis not present

## 2013-12-13 DIAGNOSIS — R972 Elevated prostate specific antigen [PSA]: Secondary | ICD-10-CM | POA: Diagnosis not present

## 2014-03-10 DIAGNOSIS — N401 Enlarged prostate with lower urinary tract symptoms: Secondary | ICD-10-CM | POA: Diagnosis not present

## 2014-03-10 DIAGNOSIS — L570 Actinic keratosis: Secondary | ICD-10-CM | POA: Diagnosis not present

## 2014-03-10 DIAGNOSIS — R12 Heartburn: Secondary | ICD-10-CM | POA: Diagnosis not present

## 2014-03-10 DIAGNOSIS — M1990 Unspecified osteoarthritis, unspecified site: Secondary | ICD-10-CM | POA: Diagnosis not present

## 2014-03-10 DIAGNOSIS — Z1389 Encounter for screening for other disorder: Secondary | ICD-10-CM | POA: Diagnosis not present

## 2014-03-10 DIAGNOSIS — E785 Hyperlipidemia, unspecified: Secondary | ICD-10-CM | POA: Diagnosis not present

## 2014-03-10 DIAGNOSIS — Z6827 Body mass index (BMI) 27.0-27.9, adult: Secondary | ICD-10-CM | POA: Diagnosis not present

## 2014-04-06 DIAGNOSIS — D225 Melanocytic nevi of trunk: Secondary | ICD-10-CM | POA: Diagnosis not present

## 2014-04-06 DIAGNOSIS — L570 Actinic keratosis: Secondary | ICD-10-CM | POA: Diagnosis not present

## 2014-04-06 DIAGNOSIS — D1801 Hemangioma of skin and subcutaneous tissue: Secondary | ICD-10-CM | POA: Diagnosis not present

## 2014-04-06 DIAGNOSIS — L821 Other seborrheic keratosis: Secondary | ICD-10-CM | POA: Diagnosis not present

## 2014-05-06 ENCOUNTER — Encounter: Payer: Self-pay | Admitting: Gastroenterology

## 2014-05-13 DIAGNOSIS — R972 Elevated prostate specific antigen [PSA]: Secondary | ICD-10-CM | POA: Diagnosis not present

## 2014-05-20 DIAGNOSIS — N401 Enlarged prostate with lower urinary tract symptoms: Secondary | ICD-10-CM | POA: Diagnosis not present

## 2014-05-20 DIAGNOSIS — R3916 Straining to void: Secondary | ICD-10-CM | POA: Diagnosis not present

## 2014-05-20 DIAGNOSIS — R972 Elevated prostate specific antigen [PSA]: Secondary | ICD-10-CM | POA: Diagnosis not present

## 2014-06-14 DIAGNOSIS — E785 Hyperlipidemia, unspecified: Secondary | ICD-10-CM | POA: Diagnosis not present

## 2014-06-14 DIAGNOSIS — Z6827 Body mass index (BMI) 27.0-27.9, adult: Secondary | ICD-10-CM | POA: Diagnosis not present

## 2014-06-14 DIAGNOSIS — I10 Essential (primary) hypertension: Secondary | ICD-10-CM | POA: Diagnosis not present

## 2014-06-14 DIAGNOSIS — M1990 Unspecified osteoarthritis, unspecified site: Secondary | ICD-10-CM | POA: Diagnosis not present

## 2014-06-23 DIAGNOSIS — H2513 Age-related nuclear cataract, bilateral: Secondary | ICD-10-CM | POA: Diagnosis not present

## 2014-12-14 DIAGNOSIS — R972 Elevated prostate specific antigen [PSA]: Secondary | ICD-10-CM | POA: Diagnosis not present

## 2014-12-21 DIAGNOSIS — N401 Enlarged prostate with lower urinary tract symptoms: Secondary | ICD-10-CM | POA: Diagnosis not present

## 2014-12-21 DIAGNOSIS — R3916 Straining to void: Secondary | ICD-10-CM | POA: Diagnosis not present

## 2014-12-21 DIAGNOSIS — R972 Elevated prostate specific antigen [PSA]: Secondary | ICD-10-CM | POA: Diagnosis not present

## 2015-04-28 DIAGNOSIS — I10 Essential (primary) hypertension: Secondary | ICD-10-CM | POA: Diagnosis not present

## 2015-04-28 DIAGNOSIS — E559 Vitamin D deficiency, unspecified: Secondary | ICD-10-CM | POA: Diagnosis not present

## 2015-04-28 DIAGNOSIS — E784 Other hyperlipidemia: Secondary | ICD-10-CM | POA: Diagnosis not present

## 2015-04-28 DIAGNOSIS — R8299 Other abnormal findings in urine: Secondary | ICD-10-CM | POA: Diagnosis not present

## 2015-04-28 DIAGNOSIS — Z125 Encounter for screening for malignant neoplasm of prostate: Secondary | ICD-10-CM | POA: Diagnosis not present

## 2015-05-05 DIAGNOSIS — J302 Other seasonal allergic rhinitis: Secondary | ICD-10-CM | POA: Diagnosis not present

## 2015-05-05 DIAGNOSIS — Z0000 Encounter for general adult medical examination without abnormal findings: Secondary | ICD-10-CM | POA: Diagnosis not present

## 2015-05-05 DIAGNOSIS — Z6828 Body mass index (BMI) 28.0-28.9, adult: Secondary | ICD-10-CM | POA: Diagnosis not present

## 2015-05-05 DIAGNOSIS — Z1389 Encounter for screening for other disorder: Secondary | ICD-10-CM | POA: Diagnosis not present

## 2015-05-05 DIAGNOSIS — E784 Other hyperlipidemia: Secondary | ICD-10-CM | POA: Diagnosis not present

## 2015-05-05 DIAGNOSIS — R7309 Other abnormal glucose: Secondary | ICD-10-CM | POA: Diagnosis not present

## 2015-05-05 DIAGNOSIS — I10 Essential (primary) hypertension: Secondary | ICD-10-CM | POA: Diagnosis not present

## 2015-05-05 DIAGNOSIS — Z23 Encounter for immunization: Secondary | ICD-10-CM | POA: Diagnosis not present

## 2015-05-05 DIAGNOSIS — D126 Benign neoplasm of colon, unspecified: Secondary | ICD-10-CM | POA: Diagnosis not present

## 2015-05-05 DIAGNOSIS — E559 Vitamin D deficiency, unspecified: Secondary | ICD-10-CM | POA: Diagnosis not present

## 2015-05-05 DIAGNOSIS — L821 Other seborrheic keratosis: Secondary | ICD-10-CM | POA: Diagnosis not present

## 2015-05-05 DIAGNOSIS — L570 Actinic keratosis: Secondary | ICD-10-CM | POA: Diagnosis not present

## 2015-05-05 DIAGNOSIS — N183 Chronic kidney disease, stage 3 (moderate): Secondary | ICD-10-CM | POA: Diagnosis not present

## 2015-06-15 DIAGNOSIS — N183 Chronic kidney disease, stage 3 (moderate): Secondary | ICD-10-CM | POA: Diagnosis not present

## 2015-06-15 DIAGNOSIS — E119 Type 2 diabetes mellitus without complications: Secondary | ICD-10-CM | POA: Diagnosis not present

## 2015-06-15 DIAGNOSIS — I10 Essential (primary) hypertension: Secondary | ICD-10-CM | POA: Diagnosis not present

## 2015-06-15 DIAGNOSIS — Z6828 Body mass index (BMI) 28.0-28.9, adult: Secondary | ICD-10-CM | POA: Diagnosis not present

## 2015-09-13 ENCOUNTER — Ambulatory Visit (AMBULATORY_SURGERY_CENTER): Payer: Self-pay | Admitting: *Deleted

## 2015-09-13 VITALS — Ht 72.0 in | Wt 192.6 lb

## 2015-09-13 DIAGNOSIS — Z86010 Personal history of colonic polyps: Secondary | ICD-10-CM

## 2015-09-13 MED ORDER — NA SULFATE-K SULFATE-MG SULF 17.5-3.13-1.6 GM/177ML PO SOLN: ORAL | 0 refills | Status: DC

## 2015-09-13 NOTE — Progress Notes (Signed)
No egg or soy allergy  No anesthesia or intubation problems per pt  No diet medications taken  No home oxygen used   

## 2015-09-27 ENCOUNTER — Encounter: Payer: Self-pay | Admitting: Gastroenterology

## 2015-09-27 ENCOUNTER — Ambulatory Visit (AMBULATORY_SURGERY_CENTER): Payer: Medicare Other | Admitting: Gastroenterology

## 2015-09-27 VITALS — BP 139/82 | HR 58 | Temp 98.4°F | Resp 15 | Ht 72.0 in | Wt 192.0 lb

## 2015-09-27 DIAGNOSIS — Z86010 Personal history of colonic polyps: Secondary | ICD-10-CM

## 2015-09-27 DIAGNOSIS — K219 Gastro-esophageal reflux disease without esophagitis: Secondary | ICD-10-CM | POA: Diagnosis not present

## 2015-09-27 DIAGNOSIS — I10 Essential (primary) hypertension: Secondary | ICD-10-CM | POA: Diagnosis not present

## 2015-09-27 DIAGNOSIS — D122 Benign neoplasm of ascending colon: Secondary | ICD-10-CM | POA: Diagnosis not present

## 2015-09-27 DIAGNOSIS — I4891 Unspecified atrial fibrillation: Secondary | ICD-10-CM | POA: Diagnosis not present

## 2015-09-27 MED ORDER — SODIUM CHLORIDE 0.9 % IV SOLN: 500.0000 mL | INTRAVENOUS | Status: AC

## 2015-09-27 NOTE — Op Note (Signed)
[place] Patient Name: Thomas Pearson Procedure Date: 09/27/2015 9:22 AM MRN: [id number] Endoscopist: Remo Lipps P. Havery Moros , MD Age: 75 Referring MD:  Date of Birth: 01-10-1940 Gender: Male Account #: [id number] Procedure:                Colonoscopy Indications:              Surveillance: Personal history of adenomatous                            polyps on last colonoscopy 5 years ago Medicines:                Monitored Anesthesia Care Procedure:                Pre-Anesthesia Assessment:                           - Prior to the procedure, a History and Physical                            was performed, and patient medications and                            allergies were reviewed. The patient's tolerance of                            previous anesthesia was also reviewed. The risks                            and benefits of the procedure and the sedation                            options and risks were discussed with the patient.                            All questions were answered, and informed consent                            was obtained. Prior Anticoagulants: The patient has                            taken aspirin, last dose was 1 day prior to                            procedure. ASA Grade Assessment: II - A patient                            with mild systemic disease. After reviewing the                            risks and benefits, the patient was deemed in                            satisfactory condition to undergo the procedure.  After obtaining informed consent, the colonoscope                            was passed under direct vision. Throughout the                            procedure, the patient's blood pressure, pulse, and                            oxygen saturations were monitored continuously. The                            Model CF-HQ190L [id number]) scope was introduced                            through the anus and  advanced to the the cecum,                            identified by appendiceal orifice and ileocecal                            valve. The colonoscopy was performed without                            difficulty. The patient tolerated the procedure                            well. The quality of the bowel preparation was                            good. The ileocecal valve, appendiceal orifice, and                            rectum were photographed. Scope In: 9:29:02 AM Scope Out: 9:45:14 AM Scope Withdrawal Time: 0 hours 12 minutes 53 seconds  Total Procedure Duration: 0 hours 16 minutes 12 seconds  Findings:                 The perianal and digital rectal examinations were                            normal.                           A 4 mm polyp was found in the ascending colon. The                            polyp was sessile. The polyp was removed with a                            cold snare. Resection and retrieval were complete.                           Many medium-mouthed diverticula were found in the  entire colon, highest burden in the left colon with                            severe diverticulosis.                           Non-bleeding internal hemorrhoids were found during                            retroflexion.                           The exam was otherwise without abnormality. Complications:            No immediate complications. Estimated blood loss:                            Minimal. Estimated Blood Loss:     Estimated blood loss was minimal. Impression:               - One 4 mm polyp in the ascending colon, removed                            with a cold snare. Resected and retrieved.                           - Diverticulosis in the entire examined colon.                           - Non-bleeding internal hemorrhoids.                           - The examination was otherwise normal. Recommendation:           - Patient has a contact number  available for                            emergencies. The signs and symptoms of potential                            delayed complications were discussed with the                            patient. Return to normal activities tomorrow.                            Written discharge instructions were provided to the                            patient.                           - Resume previous diet.                           - Continue present medications.                           -  No ibuprofen, naproxen, or other non-steroidal                            anti-inflammatory drugs for 2 weeks after polyp                            removal.                           - Await pathology results.                           - Repeat colonoscopy is recommended for                            surveillance. The colonoscopy date will be                            determined after pathology results from today's                            exam become available for review. Remo Lipps P. Meris Reede, MD 09/27/2015 9:48:48 AM This report has been signed electronically.

## 2015-09-27 NOTE — Progress Notes (Signed)
Report to PACU, RN, vss, BBS= Clear.  

## 2015-09-27 NOTE — Progress Notes (Signed)
Pt. Has to wait for his ride to return,pt. And sister-in-law awaits ride in consultation room.

## 2015-09-27 NOTE — Progress Notes (Signed)
Called to room to assist during endoscopic procedure.  Patient ID and intended procedure confirmed with present staff. Received instructions for my participation in the procedure from the performing physician.  

## 2015-09-27 NOTE — Patient Instructions (Signed)
YOU HAD AN ENDOSCOPIC PROCEDURE TODAY AT [facility]:   Refer to the procedure report that was given to you for any specific questions about what was found during the examination.  If the procedure report does not answer your questions, please call your gastroenterologist to clarify.  If you requested that your care partner not be given the details of your procedure findings, then the procedure report has been included in a sealed envelope for you to review at your convenience later.  YOU SHOULD EXPECT: Some feelings of bloating in the abdomen. Passage of more gas than usual.  Walking can help get rid of the air that was put into your GI tract during the procedure and reduce the bloating. If you had a lower endoscopy (such as a colonoscopy or flexible sigmoidoscopy) you may notice spotting of blood in your stool or on the toilet paper. If you underwent a bowel prep for your procedure, you may not have a normal bowel movement for a few days.  Please Note:  You might notice some irritation and congestion in your nose or some drainage.  This is from the oxygen used during your procedure.  There is no need for concern and it should clear up in a day or so.  SYMPTOMS TO REPORT IMMEDIATELY:   Following lower endoscopy (colonoscopy or flexible sigmoidoscopy):  Excessive amounts of blood in the stool  Significant tenderness or worsening of abdominal pains  Swelling of the abdomen that is new, acute  Fever of 100F or higher   For urgent or emergent issues, a gastroenterologist can be reached at any hour by calling [phone].   DIET:  We do recommend a small meal at first, but then you may proceed to your regular diet.  Drink plenty of fluids but you should avoid alcoholic beverages for 24 hours.  ACTIVITY:  You should plan to take it easy for the rest of today and you should NOT DRIVE or use heavy machinery until tomorrow (because of the sedation medicines used during the test).     FOLLOW UP: Our staff will call the number listed on your records the next business day following your procedure to check on you and address any questions or concerns that you may have regarding the information given to you following your procedure. If we do not reach you, we will leave a message.  However, if you are feeling well and you are not experiencing any problems, there is no need to return our call.  We will assume that you have returned to your regular daily activities without incident.  If any biopsies were taken you will be contacted by phone or by letter within the next 1-3 weeks.  Please call us at [phone] if you have not heard about the biopsies in 3 weeks.    SIGNATURES/CONFIDENTIALITY: You and/or your care partner have signed paperwork which will be entered into your electronic medical record.  These signatures attest to the fact that that the information above on your After Visit Summary has been reviewed and is understood.  Full responsibility of the confidentiality of this discharge information lies with you and/or your care-partner.  Per Dr. Havery Moros verbal order it.'s ok to take your low does aspirin,but do not take ibuprofen,naproxen,or other non-steroidal anti-inflammatory drugs for 2 weeks,resume remainder of medications. Information given on polyps,diverticulosis and hemorrhoids.

## 2015-09-28 ENCOUNTER — Telehealth: Payer: Self-pay

## 2015-09-28 NOTE — Telephone Encounter (Signed)
  Follow up Call-  Call back number 09/27/2015  Post procedure Call Back phone  # [phone]  Permission to leave phone message Yes  Some recent data might be hidden     Patient questions:  Do you have a fever, pain , or abdominal swelling? No. Pain Score  0 *  Have you tolerated food without any problems? Yes.    Have you been able to return to your normal activities? Yes.    Do you have any questions about your discharge instructions: Diet   No. Medications  No. Follow up visit  No.  Do you have questions or concerns about your Care? No.  Actions: * If pain score is 4 or above: No action needed, pain <4.

## 2015-09-28 NOTE — Telephone Encounter (Signed)
  Follow up Call-  Call back number 09/27/2015  Post procedure Call Back phone  # [phone]  Permission to leave phone message Yes  Some recent data might be hidden    Patient was called for follow up after his procedure on 09/27/2015. No answer at the number given for follow up phone call. A message was left on the answering machine.

## 2015-10-04 ENCOUNTER — Encounter: Payer: Self-pay | Admitting: Gastroenterology

## 2015-10-04 DIAGNOSIS — Z23 Encounter for immunization: Secondary | ICD-10-CM | POA: Diagnosis not present

## 2015-10-04 DIAGNOSIS — E1129 Type 2 diabetes mellitus with other diabetic kidney complication: Secondary | ICD-10-CM | POA: Diagnosis not present

## 2015-12-20 DIAGNOSIS — R972 Elevated prostate specific antigen [PSA]: Secondary | ICD-10-CM | POA: Diagnosis not present

## 2015-12-27 DIAGNOSIS — N401 Enlarged prostate with lower urinary tract symptoms: Secondary | ICD-10-CM | POA: Diagnosis not present

## 2015-12-27 DIAGNOSIS — R972 Elevated prostate specific antigen [PSA]: Secondary | ICD-10-CM | POA: Diagnosis not present

## 2015-12-27 DIAGNOSIS — R3912 Poor urinary stream: Secondary | ICD-10-CM | POA: Diagnosis not present

## 2016-02-19 DIAGNOSIS — L0102 Bockhart's impetigo: Secondary | ICD-10-CM | POA: Diagnosis not present

## 2016-02-19 DIAGNOSIS — D485 Neoplasm of uncertain behavior of skin: Secondary | ICD-10-CM | POA: Diagnosis not present

## 2016-02-19 DIAGNOSIS — L738 Other specified follicular disorders: Secondary | ICD-10-CM | POA: Diagnosis not present

## 2016-05-07 DIAGNOSIS — N183 Chronic kidney disease, stage 3 (moderate): Secondary | ICD-10-CM | POA: Diagnosis not present

## 2016-05-07 DIAGNOSIS — Z125 Encounter for screening for malignant neoplasm of prostate: Secondary | ICD-10-CM | POA: Diagnosis not present

## 2016-05-07 DIAGNOSIS — E784 Other hyperlipidemia: Secondary | ICD-10-CM | POA: Diagnosis not present

## 2016-05-07 DIAGNOSIS — E119 Type 2 diabetes mellitus without complications: Secondary | ICD-10-CM | POA: Diagnosis not present

## 2016-05-07 DIAGNOSIS — E559 Vitamin D deficiency, unspecified: Secondary | ICD-10-CM | POA: Diagnosis not present

## 2016-05-22 DIAGNOSIS — I10 Essential (primary) hypertension: Secondary | ICD-10-CM | POA: Diagnosis not present

## 2016-05-22 DIAGNOSIS — K4090 Unilateral inguinal hernia, without obstruction or gangrene, not specified as recurrent: Secondary | ICD-10-CM | POA: Diagnosis not present

## 2016-05-22 DIAGNOSIS — Z0000 Encounter for general adult medical examination without abnormal findings: Secondary | ICD-10-CM | POA: Diagnosis not present

## 2016-05-22 DIAGNOSIS — E784 Other hyperlipidemia: Secondary | ICD-10-CM | POA: Diagnosis not present

## 2016-05-22 DIAGNOSIS — Z23 Encounter for immunization: Secondary | ICD-10-CM | POA: Diagnosis not present

## 2016-05-22 DIAGNOSIS — Z1389 Encounter for screening for other disorder: Secondary | ICD-10-CM | POA: Diagnosis not present

## 2016-05-22 DIAGNOSIS — E1122 Type 2 diabetes mellitus with diabetic chronic kidney disease: Secondary | ICD-10-CM | POA: Diagnosis not present

## 2016-05-22 DIAGNOSIS — R3121 Asymptomatic microscopic hematuria: Secondary | ICD-10-CM | POA: Diagnosis not present

## 2016-05-22 DIAGNOSIS — E559 Vitamin D deficiency, unspecified: Secondary | ICD-10-CM | POA: Diagnosis not present

## 2016-05-22 DIAGNOSIS — N401 Enlarged prostate with lower urinary tract symptoms: Secondary | ICD-10-CM | POA: Diagnosis not present

## 2016-05-22 DIAGNOSIS — Z6827 Body mass index (BMI) 27.0-27.9, adult: Secondary | ICD-10-CM | POA: Diagnosis not present

## 2016-05-22 DIAGNOSIS — N183 Chronic kidney disease, stage 3 (moderate): Secondary | ICD-10-CM | POA: Diagnosis not present

## 2016-05-22 DIAGNOSIS — L821 Other seborrheic keratosis: Secondary | ICD-10-CM | POA: Diagnosis not present

## 2016-11-15 DIAGNOSIS — E119 Type 2 diabetes mellitus without complications: Secondary | ICD-10-CM | POA: Diagnosis not present

## 2016-11-15 DIAGNOSIS — Z23 Encounter for immunization: Secondary | ICD-10-CM | POA: Diagnosis not present

## 2016-12-18 DIAGNOSIS — R972 Elevated prostate specific antigen [PSA]: Secondary | ICD-10-CM | POA: Diagnosis not present

## 2016-12-25 DIAGNOSIS — R972 Elevated prostate specific antigen [PSA]: Secondary | ICD-10-CM | POA: Diagnosis not present

## 2016-12-25 DIAGNOSIS — N401 Enlarged prostate with lower urinary tract symptoms: Secondary | ICD-10-CM | POA: Diagnosis not present

## 2016-12-25 DIAGNOSIS — R3912 Poor urinary stream: Secondary | ICD-10-CM | POA: Diagnosis not present

## 2016-12-25 DIAGNOSIS — R310 Gross hematuria: Secondary | ICD-10-CM | POA: Diagnosis not present

## 2017-01-01 DIAGNOSIS — R310 Gross hematuria: Secondary | ICD-10-CM | POA: Diagnosis not present

## 2017-01-01 DIAGNOSIS — R3129 Other microscopic hematuria: Secondary | ICD-10-CM | POA: Diagnosis not present

## 2017-01-09 DIAGNOSIS — N400 Enlarged prostate without lower urinary tract symptoms: Secondary | ICD-10-CM | POA: Diagnosis not present

## 2017-01-09 DIAGNOSIS — R310 Gross hematuria: Secondary | ICD-10-CM | POA: Diagnosis not present

## 2017-04-03 DIAGNOSIS — M25571 Pain in right ankle and joints of right foot: Secondary | ICD-10-CM | POA: Diagnosis not present

## 2017-04-03 DIAGNOSIS — M76821 Posterior tibial tendinitis, right leg: Secondary | ICD-10-CM | POA: Diagnosis not present

## 2017-04-03 DIAGNOSIS — M76829 Posterior tibial tendinitis, unspecified leg: Secondary | ICD-10-CM | POA: Diagnosis not present

## 2017-04-18 ENCOUNTER — Encounter: Payer: Self-pay | Admitting: Physical Therapy

## 2017-04-18 ENCOUNTER — Ambulatory Visit: Payer: Medicare Other | Attending: Student | Admitting: Physical Therapy

## 2017-04-18 DIAGNOSIS — M25571 Pain in right ankle and joints of right foot: Secondary | ICD-10-CM | POA: Diagnosis not present

## 2017-04-18 DIAGNOSIS — R262 Difficulty in walking, not elsewhere classified: Secondary | ICD-10-CM | POA: Diagnosis not present

## 2017-04-18 NOTE — Therapy (Signed)
[facility] [place], [place], [address] Phone: [phone]   Fax:  [phone]  Physical Therapy Evaluation  Patient Details  Name: Thomas Pearson MRN: [id number] Date of Birth: 1940/04/26 Referring Provider: Doran Durand   Encounter Date: 04/18/2017  PT End of Session - 04/18/17 0919    Visit Number  1    Date for PT Re-Evaluation  06/18/17    PT Start Time  0840    PT Stop Time  0930    PT Time Calculation (min)  50 min    Activity Tolerance  Patient tolerated treatment well    Behavior During Therapy  [facility] for tasks assessed/performed       Past Medical History:  Diagnosis Date  . Allergy    hay fever  . Arthritis    osteoarthritis  . BPH (benign prostatic hyperplasia)   . Diverticulitis   . GERD (gastroesophageal reflux disease)   . Hiatal hernia    NO SURGERY FOR THIS  . Hyperlipidemia   . Hypertension   . Pancreatitis     Past Surgical History:  Procedure Laterality Date  . COLONOSCOPY    . PROSTATE SURGERY     bx   . TOTAL KNEE ARTHROPLASTY  12/28/2010   Procedure: TOTAL KNEE ARTHROPLASTY;  Surgeon: Yvette Rack., MD;  Location: [place];  Service: Orthopedics;  Laterality: Left;  left total knee - [id number]  . TOTAL KNEE ARTHROPLASTY  12/27/2011   Procedure: TOTAL KNEE ARTHROPLASTY;  Surgeon: Yvette Rack., MD;  Location: [place];  Service: Orthopedics;  Laterality: Right;  Right Knee Arthroplasty/Knee Medial and Lateral Compartments with Patella Resurfacing  . TRANSURETHRAL RESECTION OF PROSTATE      There were no vitals filed for this visit.   Subjective Assessment - 04/18/17 0848    Subjective  Patient reports that he started having pain about 1-2 months ago.  He is unsure of a cause.  Patient is diagnosed with posterior tibialis tendonitis.  He reports that he was advised to get inserts in his shoes.    Limitations  Walking;House hold activities    Patient Stated Goals  have less  pain, walk better    Currently in Pain?  Yes    Pain Score  1     Pain Location  Foot    Pain Orientation  Right    Pain Descriptors / Indicators  Burning;Aching;Sore    Pain Type  Acute pain    Pain Onset  More than a month ago    Pain Frequency  Constant    Aggravating Factors   pain up to 5/10 with walking    Pain Relieving Factors  rest, being off feet pain can be 0/10    Effect of Pain on Daily Activities  reports difficulty walking         [facility] PT Assessment - 04/18/17 0001      Assessment   Medical Diagnosis  right posterior tibialis tendonitis    Referring Provider  Hewitt    Onset Date/Surgical Date  03/18/17    Prior Therapy  none      Precautions   Precautions  None      Balance Screen   Has the patient fallen in the past 6 months  No    Has the patient had a decrease in activity level because of a fear of falling?   No    Is the patient reluctant to leave  their home because of a fear of falling?   No      Home Environment   Additional Comments  has stairs, does yardwork      Prior Function   Level of Independence  Independent    Vocation  Retired    Leisure  golf 2x/week      ROM / Strength   AROM / PROM / Strength  AROM;Strength      AROM   AROM Assessment Site  Ankle    Right/Left Ankle  Right    Right Ankle Dorsiflexion  4    Right Ankle Plantar Flexion  44    Right Ankle Inversion  25    Right Ankle Eversion  12      Strength   Overall Strength Comments  WNL's except for inversion is 4-/5 with some navicular area pain      Palpation   Palpation comment  very tender b/n the navicular and the medial malleolous, very tender in the PF area      Ambulation/Gait   Gait Comments  Patient has an antalgic gait with pain at mid stance to toe off phase, he reports a stingy sensation reports "not bad" however has a pretty significant limp which I told him worries me that he may cause other problems                Objective measurements  completed on examination: See above findings.              PT Education - 04/18/17 [phone]    Education provided  Yes    Education Details  calf stretch    Person(s) Educated  Patient    Methods  Explanation;Demonstration;Handout;Verbal cues    Comprehension  Verbalized understanding       PT Short Term Goals - 04/18/17 0923      PT SHORT TERM GOAL #1   Title  independent with initial HEP    Time  2    Period  Weeks    Status  New        PT Long Term Goals - 04/18/17 5361      PT LONG TERM GOAL #1   Title  reports pain decreased 50%    Time  8    Period  Weeks    Status  New      PT LONG TERM GOAL #2   Title  walk without limp    Time  8    Period  Weeks    Status  New      PT LONG TERM GOAL #3   Title  increase DF to 10 degrees    Time  8    Period  Weeks    Status  New             Plan - 04/18/17 0919    Clinical Impression Statement  Patient reports right medial foot pain over the past few months, unsure of a cause, MD diagnosis is posterior tibialis tendonitis, he has been put in a brace and reports "a little better".  He is very tender b/n the navicular and the medial malleolous.  Has a limp with pain at mid stance to toe off.    Clinical Presentation  Stable    Clinical Decision Making  Low    Rehab Potential  Good    PT Frequency  1x / week    PT Duration  8 weeks    PT Treatment/Interventions  ADLs/Self Care  Home Management;Cryotherapy;Electrical Stimulation;Iontophoresis 4mg /ml Dexamethasone;Moist Heat;Gait training;Stair training;Therapeutic activities;Therapeutic exercise;Functional mobility training;Neuromuscular re-education;Patient/family education;Manual techniques;Vasopneumatic Device    PT Next Visit Plan  patient to do exercises at home put on patch, today, will see in a few weeks    Consulted and Agree with Plan of Care  Patient       Patient will benefit from skilled therapeutic intervention in order to improve the following  deficits and impairments:  Abnormal gait, Decreased range of motion, Difficulty walking, Increased fascial restricitons, Pain, Impaired flexibility, Decreased mobility, Decreased strength  Visit Diagnosis: Pain in right ankle and joints of right foot - Plan: PT plan of care cert/re-cert  Difficulty in walking, not elsewhere classified - Plan: PT plan of care cert/re-cert     Problem List Patient Active Problem List   Diagnosis Date Noted  . Hyperlipidemia   . GERD (gastroesophageal reflux disease)   . Right knee pain 11/20/2011  . PAF (paroxysmal atrial fibrillation) ([facility]) 02/07/2011  . Diarrhea 01/24/2011  . S/P knee replacement 01/22/2011  . Diverticulitis of colon (without mention of hemorrhage)(562.11) 09/18/2010  . Personal history of colonic polyps 09/18/2010  . ACUTE PANCREATITIS 06/29/2009  . DEGENERATIVE DISC DISEASE, CERVICAL SPINE 06/29/2009  . ELEVATED PROSTATE SPECIFIC ANTIGEN 06/29/2009  . ABNORMAL ELECTROCARDIOGRAM 06/28/2009  . DIZZINESS 02/10/2007  . COLONIC POLYPS 11/24/2006  . HYPERCHOLESTEROLEMIA 11/24/2006  . ALLERGIC RHINITIS 11/24/2006  . HIATAL HERNIA 11/24/2006  . DIVERTICULOSIS, COLON 11/24/2006  . DEGENERATIVE JOINT DISEASE 11/24/2006  . BENIGN PROSTATIC HYPERTROPHY, HX OF 11/24/2006  . ESOPHAGITIS, REFLUX 04/23/2000    Sumner Boast., PT 04/18/2017, 10:08 AM  [facility] [address] [address] [place], [place], [address] Phone: [phone]   Fax:  [phone]  Name: CANDACE RAMUS MRN: [id number] Date of Birth: 07/01/1940

## 2017-04-28 ENCOUNTER — Ambulatory Visit: Payer: Medicare Other | Admitting: Physical Therapy

## 2017-04-28 ENCOUNTER — Encounter: Payer: Self-pay | Admitting: Physical Therapy

## 2017-04-28 DIAGNOSIS — R262 Difficulty in walking, not elsewhere classified: Secondary | ICD-10-CM

## 2017-04-28 DIAGNOSIS — M25571 Pain in right ankle and joints of right foot: Secondary | ICD-10-CM

## 2017-04-28 NOTE — Therapy (Signed)
[place], [place], [address] Phone: [phone]   Fax:  [phone]  Physical Therapy Treatment  Patient Details  Name: SUZANNE GARBERS MRN: [id number] Date of Birth: 1940-07-24 Referring Provider: Doran Durand   Encounter Date: 04/28/2017  PT End of Session - 04/28/17 0829    Visit Number  2    Date for PT Re-Evaluation  06/18/17    PT Start Time  0745    PT Stop Time  0831    PT Time Calculation (min)  46 min    Activity Tolerance  Patient tolerated treatment well    Behavior During Therapy  [facility] for tasks assessed/performed       Past Medical History:  Diagnosis Date  . Allergy    hay fever  . Arthritis    osteoarthritis  . BPH (benign prostatic hyperplasia)   . Diverticulitis   . GERD (gastroesophageal reflux disease)   . Hiatal hernia    NO SURGERY FOR THIS  . Hyperlipidemia   . Hypertension   . Pancreatitis     Past Surgical History:  Procedure Laterality Date  . COLONOSCOPY    . PROSTATE SURGERY     bx   . TOTAL KNEE ARTHROPLASTY  12/28/2010   Procedure: TOTAL KNEE ARTHROPLASTY;  Surgeon: Yvette Rack., MD;  Location: [place];  Service: Orthopedics;  Laterality: Left;  left total knee - [id number]  . TOTAL KNEE ARTHROPLASTY  12/27/2011   Procedure: TOTAL KNEE ARTHROPLASTY;  Surgeon: Yvette Rack., MD;  Location: [place];  Service: Orthopedics;  Laterality: Right;  Right Knee Arthroplasty/Knee Medial and Lateral Compartments with Patella Resurfacing  . TRANSURETHRAL RESECTION OF PROSTATE      There were no vitals filed for this visit.  Subjective Assessment - 04/28/17 0754    Subjective  Patient reports that he is doing very well, "I think the patch really helped".  Reports that he played golf and did not have any issues, reports that he did not wear the brace    Currently in Pain?  No/denies                       [facility] Adult PT Treatment/Exercise - 04/28/17  0001      Ambulation/Gait   Gait Comments  gait with patient around the building, brisk pace up and down hill, does have some limp but reports no pain      Exercises   Exercises  Ankle      Modalities   Modalities  Iontophoresis      Iontophoresis   Type of Iontophoresis  Dexamethasone    Location  right navicular area    Dose  40m    Time  4 hour patch      Manual Therapy   Manual Therapy  Soft tissue mobilization    Soft tissue mobilization  CFM to the PF on the right foot, great toe stretch passively      Ankle Exercises: Stretches   Plantar Fascia Stretch  3 reps;20 seconds    Soleus Stretch  3 reps;10 seconds    Gastroc Stretch  3 reps;20 seconds      Ankle Exercises: Aerobic   Nustep  level 4 x 6 minutes             PT Education - 04/28/17 0828    Education provided  Yes    Education Details  went over PF  care with ball rolling, frozen water bottle rolling, and stretches    Person(s) Educated  Patient    Methods  Demonstration;Handout    Comprehension  Verbalized understanding;Returned demonstration       PT Short Term Goals - 04/28/17 0830      PT SHORT TERM GOAL #1   Title  independent with initial HEP    Status  Achieved        PT Long Term Goals - 04/28/17 0830      PT LONG TERM GOAL #1   Title  reports pain decreased 50%    Status  Achieved      PT LONG TERM GOAL #2   Title  walk without limp    Status  Achieved      PT LONG TERM GOAL #3   Title  increase DF to 10 degrees    Status  Achieved            Plan - 04/28/17 0829    Clinical Impression Statement  Pateint reports minimal to no pain, reports that he was able to play golf 2x last week without issue.  He is tight in the PF and the calves.    PT Next Visit Plan  will d/c with goals met    Consulted and Agree with Plan of Care  Patient       Patient will benefit from skilled therapeutic intervention in order to improve the following deficits and impairments:  Abnormal  gait, Decreased range of motion, Difficulty walking, Increased fascial restricitons, Pain, Impaired flexibility, Decreased mobility, Decreased strength  Visit Diagnosis: Pain in right ankle and joints of right foot  Difficulty in walking, not elsewhere classified     Problem List Patient Active Problem List   Diagnosis Date Noted  . Hyperlipidemia   . GERD (gastroesophageal reflux disease)   . Right knee pain 11/20/2011  . PAF (paroxysmal atrial fibrillation) ([place]) 02/07/2011  . Diarrhea 01/24/2011  . S/P knee replacement 01/22/2011  . Diverticulitis of colon (without mention of hemorrhage)(562.11) 09/18/2010  . Personal history of colonic polyps 09/18/2010  . ACUTE PANCREATITIS 06/29/2009  . DEGENERATIVE DISC DISEASE, CERVICAL SPINE 06/29/2009  . ELEVATED PROSTATE SPECIFIC ANTIGEN 06/29/2009  . ABNORMAL ELECTROCARDIOGRAM 06/28/2009  . DIZZINESS 02/10/2007  . COLONIC POLYPS 11/24/2006  . HYPERCHOLESTEROLEMIA 11/24/2006  . ALLERGIC RHINITIS 11/24/2006  . HIATAL HERNIA 11/24/2006  . DIVERTICULOSIS, COLON 11/24/2006  . DEGENERATIVE JOINT DISEASE 11/24/2006  . BENIGN PROSTATIC HYPERTROPHY, HX OF 11/24/2006  . ESOPHAGITIS, REFLUX 04/23/2000    Sumner Boast., PT 04/28/2017, 8:31 AM  [place] [address] [place], [place], [address] Phone: [phone]   Fax:  [phone]  Name: RECTOR DEVONSHIRE MRN: [id number] Date of Birth: 12-30-1940

## 2017-07-01 DIAGNOSIS — N183 Chronic kidney disease, stage 3 (moderate): Secondary | ICD-10-CM | POA: Diagnosis not present

## 2017-07-01 DIAGNOSIS — E7849 Other hyperlipidemia: Secondary | ICD-10-CM | POA: Diagnosis not present

## 2017-07-01 DIAGNOSIS — E559 Vitamin D deficiency, unspecified: Secondary | ICD-10-CM | POA: Diagnosis not present

## 2017-07-01 DIAGNOSIS — R82998 Other abnormal findings in urine: Secondary | ICD-10-CM | POA: Diagnosis not present

## 2017-07-01 DIAGNOSIS — Z125 Encounter for screening for malignant neoplasm of prostate: Secondary | ICD-10-CM | POA: Diagnosis not present

## 2017-07-01 DIAGNOSIS — E119 Type 2 diabetes mellitus without complications: Secondary | ICD-10-CM | POA: Diagnosis not present

## 2017-07-04 DIAGNOSIS — H52223 Regular astigmatism, bilateral: Secondary | ICD-10-CM | POA: Diagnosis not present

## 2017-07-04 DIAGNOSIS — H524 Presbyopia: Secondary | ICD-10-CM | POA: Diagnosis not present

## 2017-07-04 DIAGNOSIS — H2513 Age-related nuclear cataract, bilateral: Secondary | ICD-10-CM | POA: Diagnosis not present

## 2017-07-08 DIAGNOSIS — E559 Vitamin D deficiency, unspecified: Secondary | ICD-10-CM | POA: Diagnosis not present

## 2017-07-08 DIAGNOSIS — R7309 Other abnormal glucose: Secondary | ICD-10-CM | POA: Diagnosis not present

## 2017-07-08 DIAGNOSIS — Z0000 Encounter for general adult medical examination without abnormal findings: Secondary | ICD-10-CM | POA: Diagnosis not present

## 2017-07-08 DIAGNOSIS — N183 Chronic kidney disease, stage 3 (moderate): Secondary | ICD-10-CM | POA: Diagnosis not present

## 2017-07-08 DIAGNOSIS — Z1389 Encounter for screening for other disorder: Secondary | ICD-10-CM | POA: Diagnosis not present

## 2017-07-08 DIAGNOSIS — K4090 Unilateral inguinal hernia, without obstruction or gangrene, not specified as recurrent: Secondary | ICD-10-CM | POA: Diagnosis not present

## 2017-07-08 DIAGNOSIS — R808 Other proteinuria: Secondary | ICD-10-CM | POA: Diagnosis not present

## 2017-07-08 DIAGNOSIS — E1169 Type 2 diabetes mellitus with other specified complication: Secondary | ICD-10-CM | POA: Diagnosis not present

## 2017-07-08 DIAGNOSIS — E7849 Other hyperlipidemia: Secondary | ICD-10-CM | POA: Diagnosis not present

## 2017-07-08 DIAGNOSIS — I10 Essential (primary) hypertension: Secondary | ICD-10-CM | POA: Diagnosis not present

## 2017-07-08 DIAGNOSIS — Z6827 Body mass index (BMI) 27.0-27.9, adult: Secondary | ICD-10-CM | POA: Diagnosis not present

## 2017-07-08 DIAGNOSIS — N401 Enlarged prostate with lower urinary tract symptoms: Secondary | ICD-10-CM | POA: Diagnosis not present

## 2017-07-15 DIAGNOSIS — Z1212 Encounter for screening for malignant neoplasm of rectum: Secondary | ICD-10-CM | POA: Diagnosis not present

## 2017-07-25 DIAGNOSIS — R972 Elevated prostate specific antigen [PSA]: Secondary | ICD-10-CM | POA: Diagnosis not present

## 2017-08-01 DIAGNOSIS — R310 Gross hematuria: Secondary | ICD-10-CM | POA: Diagnosis not present

## 2017-08-01 DIAGNOSIS — R3912 Poor urinary stream: Secondary | ICD-10-CM | POA: Diagnosis not present

## 2017-08-01 DIAGNOSIS — N401 Enlarged prostate with lower urinary tract symptoms: Secondary | ICD-10-CM | POA: Diagnosis not present

## 2017-08-01 DIAGNOSIS — R972 Elevated prostate specific antigen [PSA]: Secondary | ICD-10-CM | POA: Diagnosis not present

## 2018-01-26 DIAGNOSIS — E1169 Type 2 diabetes mellitus with other specified complication: Secondary | ICD-10-CM | POA: Diagnosis not present

## 2018-07-31 DIAGNOSIS — R972 Elevated prostate specific antigen [PSA]: Secondary | ICD-10-CM | POA: Diagnosis not present

## 2018-08-07 DIAGNOSIS — E1169 Type 2 diabetes mellitus with other specified complication: Secondary | ICD-10-CM | POA: Diagnosis not present

## 2018-08-07 DIAGNOSIS — E559 Vitamin D deficiency, unspecified: Secondary | ICD-10-CM | POA: Diagnosis not present

## 2018-08-07 DIAGNOSIS — R3912 Poor urinary stream: Secondary | ICD-10-CM | POA: Diagnosis not present

## 2018-08-07 DIAGNOSIS — N401 Enlarged prostate with lower urinary tract symptoms: Secondary | ICD-10-CM | POA: Diagnosis not present

## 2018-08-07 DIAGNOSIS — E7849 Other hyperlipidemia: Secondary | ICD-10-CM | POA: Diagnosis not present

## 2018-08-07 DIAGNOSIS — R972 Elevated prostate specific antigen [PSA]: Secondary | ICD-10-CM | POA: Diagnosis not present

## 2018-08-10 DIAGNOSIS — R82998 Other abnormal findings in urine: Secondary | ICD-10-CM | POA: Diagnosis not present

## 2018-08-10 DIAGNOSIS — E1169 Type 2 diabetes mellitus with other specified complication: Secondary | ICD-10-CM | POA: Diagnosis not present

## 2018-08-14 DIAGNOSIS — Z0000 Encounter for general adult medical examination without abnormal findings: Secondary | ICD-10-CM | POA: Diagnosis not present

## 2018-08-14 DIAGNOSIS — R739 Hyperglycemia, unspecified: Secondary | ICD-10-CM | POA: Diagnosis not present

## 2018-08-14 DIAGNOSIS — E559 Vitamin D deficiency, unspecified: Secondary | ICD-10-CM | POA: Diagnosis not present

## 2018-08-14 DIAGNOSIS — L821 Other seborrheic keratosis: Secondary | ICD-10-CM | POA: Diagnosis not present

## 2018-08-14 DIAGNOSIS — D126 Benign neoplasm of colon, unspecified: Secondary | ICD-10-CM | POA: Diagnosis not present

## 2018-08-14 DIAGNOSIS — R011 Cardiac murmur, unspecified: Secondary | ICD-10-CM | POA: Diagnosis not present

## 2018-08-14 DIAGNOSIS — E785 Hyperlipidemia, unspecified: Secondary | ICD-10-CM | POA: Diagnosis not present

## 2018-08-14 DIAGNOSIS — I10 Essential (primary) hypertension: Secondary | ICD-10-CM | POA: Diagnosis not present

## 2018-08-14 DIAGNOSIS — R809 Proteinuria, unspecified: Secondary | ICD-10-CM | POA: Diagnosis not present

## 2018-08-14 DIAGNOSIS — N183 Chronic kidney disease, stage 3 (moderate): Secondary | ICD-10-CM | POA: Diagnosis not present

## 2018-08-14 DIAGNOSIS — D1800 Hemangioma unspecified site: Secondary | ICD-10-CM | POA: Diagnosis not present

## 2018-08-14 DIAGNOSIS — E1169 Type 2 diabetes mellitus with other specified complication: Secondary | ICD-10-CM | POA: Diagnosis not present

## 2018-08-14 DIAGNOSIS — Z1331 Encounter for screening for depression: Secondary | ICD-10-CM | POA: Diagnosis not present

## 2018-09-18 DIAGNOSIS — Z23 Encounter for immunization: Secondary | ICD-10-CM | POA: Diagnosis not present

## 2019-02-23 DIAGNOSIS — E1169 Type 2 diabetes mellitus with other specified complication: Secondary | ICD-10-CM | POA: Diagnosis not present

## 2019-03-18 ENCOUNTER — Ambulatory Visit: Payer: Medicare Other | Attending: Internal Medicine

## 2019-03-18 DIAGNOSIS — Z23 Encounter for immunization: Secondary | ICD-10-CM

## 2019-03-18 NOTE — Progress Notes (Signed)
   Covid-19 Vaccination Clinic  Name:  Thomas Pearson    MRN: [id number] DOB: 1940-02-27  03/18/2019  [name] was observed post Covid-19 immunization for 15 minutes without incident. He was provided with Vaccine Information Sheet and instruction to access the V-Safe system.   [name] was instructed to call 911 with any severe reactions post vaccine: [name] Kitchen Difficulty breathing  . Swelling of face and throat  . A fast heartbeat  . A bad rash all over body  . Dizziness and weakness   Immunizations Administered    Name Date Dose VIS Date Route   Pfizer COVID-19 Vaccine 03/18/2019  4:13 PM 0.3 mL 12/18/2018 Intramuscular   Manufacturer: [place]   Lot: [id number]   [place]: [id number]

## 2019-04-12 ENCOUNTER — Ambulatory Visit: Payer: Medicare Other | Attending: Internal Medicine

## 2019-04-12 DIAGNOSIS — Z23 Encounter for immunization: Secondary | ICD-10-CM

## 2019-04-12 NOTE — Progress Notes (Signed)
   Covid-19 Vaccination Clinic  Name:  Thomas Pearson    MRN: [id number] DOB: Sep 27, 1940  04/12/2019  [name] was observed post Covid-19 immunization for 15 minutes without incident. He was provided with Vaccine Information Sheet and instruction to access the V-Safe system.   [name] was instructed to call 911 with any severe reactions post vaccine: [name] Kitchen Difficulty breathing  . Swelling of face and throat  . A fast heartbeat  . A bad rash all over body  . Dizziness and weakness   Immunizations Administered    Name Date Dose VIS Date Route   Pfizer COVID-19 Vaccine 04/12/2019 10:42 AM 0.3 mL 12/18/2018 Intramuscular   Manufacturer: [place]   Lot: [id number]   [place]: [id number]

## 2019-04-26 DIAGNOSIS — H2513 Age-related nuclear cataract, bilateral: Secondary | ICD-10-CM | POA: Diagnosis not present

## 2019-04-26 DIAGNOSIS — Z83511 Family history of glaucoma: Secondary | ICD-10-CM | POA: Diagnosis not present

## 2019-04-26 DIAGNOSIS — H43811 Vitreous degeneration, right eye: Secondary | ICD-10-CM | POA: Diagnosis not present

## 2019-05-12 DIAGNOSIS — E1169 Type 2 diabetes mellitus with other specified complication: Secondary | ICD-10-CM | POA: Diagnosis not present

## 2019-08-07 DIAGNOSIS — R972 Elevated prostate specific antigen [PSA]: Secondary | ICD-10-CM | POA: Diagnosis not present

## 2019-08-10 DIAGNOSIS — R972 Elevated prostate specific antigen [PSA]: Secondary | ICD-10-CM | POA: Diagnosis not present

## 2019-08-10 DIAGNOSIS — N401 Enlarged prostate with lower urinary tract symptoms: Secondary | ICD-10-CM | POA: Diagnosis not present

## 2019-08-10 DIAGNOSIS — R3912 Poor urinary stream: Secondary | ICD-10-CM | POA: Diagnosis not present

## 2019-08-20 DIAGNOSIS — E1169 Type 2 diabetes mellitus with other specified complication: Secondary | ICD-10-CM | POA: Diagnosis not present

## 2019-08-20 DIAGNOSIS — E785 Hyperlipidemia, unspecified: Secondary | ICD-10-CM | POA: Diagnosis not present

## 2019-08-20 DIAGNOSIS — E559 Vitamin D deficiency, unspecified: Secondary | ICD-10-CM | POA: Diagnosis not present

## 2019-08-20 DIAGNOSIS — Z125 Encounter for screening for malignant neoplasm of prostate: Secondary | ICD-10-CM | POA: Diagnosis not present

## 2019-08-26 DIAGNOSIS — Z1331 Encounter for screening for depression: Secondary | ICD-10-CM | POA: Diagnosis not present

## 2019-08-26 DIAGNOSIS — I10 Essential (primary) hypertension: Secondary | ICD-10-CM | POA: Diagnosis not present

## 2019-08-26 DIAGNOSIS — R809 Proteinuria, unspecified: Secondary | ICD-10-CM | POA: Diagnosis not present

## 2019-08-26 DIAGNOSIS — N401 Enlarged prostate with lower urinary tract symptoms: Secondary | ICD-10-CM | POA: Diagnosis not present

## 2019-08-26 DIAGNOSIS — R82998 Other abnormal findings in urine: Secondary | ICD-10-CM | POA: Diagnosis not present

## 2019-08-26 DIAGNOSIS — E1169 Type 2 diabetes mellitus with other specified complication: Secondary | ICD-10-CM | POA: Diagnosis not present

## 2019-08-26 DIAGNOSIS — E785 Hyperlipidemia, unspecified: Secondary | ICD-10-CM | POA: Diagnosis not present

## 2019-08-26 DIAGNOSIS — R12 Heartburn: Secondary | ICD-10-CM | POA: Diagnosis not present

## 2019-08-26 DIAGNOSIS — Z0000 Encounter for general adult medical examination without abnormal findings: Secondary | ICD-10-CM | POA: Diagnosis not present

## 2019-08-26 DIAGNOSIS — L570 Actinic keratosis: Secondary | ICD-10-CM | POA: Diagnosis not present

## 2019-08-26 DIAGNOSIS — M1990 Unspecified osteoarthritis, unspecified site: Secondary | ICD-10-CM | POA: Diagnosis not present

## 2019-08-26 DIAGNOSIS — R3121 Asymptomatic microscopic hematuria: Secondary | ICD-10-CM | POA: Diagnosis not present

## 2019-08-26 DIAGNOSIS — L820 Inflamed seborrheic keratosis: Secondary | ICD-10-CM | POA: Diagnosis not present

## 2019-08-27 DIAGNOSIS — Z1212 Encounter for screening for malignant neoplasm of rectum: Secondary | ICD-10-CM | POA: Diagnosis not present

## 2019-09-16 ENCOUNTER — Other Ambulatory Visit: Payer: Self-pay | Admitting: Internal Medicine

## 2019-09-16 DIAGNOSIS — E785 Hyperlipidemia, unspecified: Secondary | ICD-10-CM

## 2019-10-01 ENCOUNTER — Ambulatory Visit
Admission: RE | Admit: 2019-10-01 | Discharge: 2019-10-01 | Disposition: A | Discharge status: Home / Self Care | Payer: Medicare Other | Source: Ambulatory Visit | Attending: Internal Medicine | Admitting: Internal Medicine

## 2019-10-01 DIAGNOSIS — E785 Hyperlipidemia, unspecified: Secondary | ICD-10-CM

## 2019-10-01 DIAGNOSIS — I700 Atherosclerosis of aorta: Secondary | ICD-10-CM | POA: Diagnosis not present

## 2020-02-28 DIAGNOSIS — I10 Essential (primary) hypertension: Secondary | ICD-10-CM | POA: Diagnosis not present

## 2020-02-28 DIAGNOSIS — I700 Atherosclerosis of aorta: Secondary | ICD-10-CM | POA: Diagnosis not present

## 2020-02-28 DIAGNOSIS — N401 Enlarged prostate with lower urinary tract symptoms: Secondary | ICD-10-CM | POA: Diagnosis not present

## 2020-02-28 DIAGNOSIS — E785 Hyperlipidemia, unspecified: Secondary | ICD-10-CM | POA: Diagnosis not present

## 2020-02-28 DIAGNOSIS — E1169 Type 2 diabetes mellitus with other specified complication: Secondary | ICD-10-CM | POA: Diagnosis not present

## 2020-02-28 DIAGNOSIS — I712 Thoracic aortic aneurysm, without rupture: Secondary | ICD-10-CM | POA: Diagnosis not present

## 2020-05-01 DIAGNOSIS — H43811 Vitreous degeneration, right eye: Secondary | ICD-10-CM | POA: Diagnosis not present

## 2020-05-01 DIAGNOSIS — Z83511 Family history of glaucoma: Secondary | ICD-10-CM | POA: Diagnosis not present

## 2020-05-01 DIAGNOSIS — H2513 Age-related nuclear cataract, bilateral: Secondary | ICD-10-CM | POA: Diagnosis not present

## 2020-05-01 DIAGNOSIS — H40023 Open angle with borderline findings, high risk, bilateral: Secondary | ICD-10-CM | POA: Diagnosis not present

## 2020-07-03 DIAGNOSIS — H401131 Primary open-angle glaucoma, bilateral, mild stage: Secondary | ICD-10-CM | POA: Diagnosis not present

## 2020-07-24 DIAGNOSIS — H401131 Primary open-angle glaucoma, bilateral, mild stage: Secondary | ICD-10-CM | POA: Diagnosis not present

## 2020-07-24 DIAGNOSIS — H2513 Age-related nuclear cataract, bilateral: Secondary | ICD-10-CM | POA: Diagnosis not present

## 2020-07-24 DIAGNOSIS — Z83511 Family history of glaucoma: Secondary | ICD-10-CM | POA: Diagnosis not present

## 2020-07-24 DIAGNOSIS — H43811 Vitreous degeneration, right eye: Secondary | ICD-10-CM | POA: Diagnosis not present

## 2020-08-14 DIAGNOSIS — H401131 Primary open-angle glaucoma, bilateral, mild stage: Secondary | ICD-10-CM | POA: Diagnosis not present

## 2020-08-18 DIAGNOSIS — R972 Elevated prostate specific antigen [PSA]: Secondary | ICD-10-CM | POA: Diagnosis not present

## 2020-08-18 DIAGNOSIS — N401 Enlarged prostate with lower urinary tract symptoms: Secondary | ICD-10-CM | POA: Diagnosis not present

## 2020-08-18 DIAGNOSIS — R3912 Poor urinary stream: Secondary | ICD-10-CM | POA: Diagnosis not present

## 2020-09-05 DIAGNOSIS — Z125 Encounter for screening for malignant neoplasm of prostate: Secondary | ICD-10-CM | POA: Diagnosis not present

## 2020-09-05 DIAGNOSIS — E559 Vitamin D deficiency, unspecified: Secondary | ICD-10-CM | POA: Diagnosis not present

## 2020-09-05 DIAGNOSIS — E785 Hyperlipidemia, unspecified: Secondary | ICD-10-CM | POA: Diagnosis not present

## 2020-09-05 DIAGNOSIS — E1169 Type 2 diabetes mellitus with other specified complication: Secondary | ICD-10-CM | POA: Diagnosis not present

## 2020-09-12 DIAGNOSIS — E1169 Type 2 diabetes mellitus with other specified complication: Secondary | ICD-10-CM | POA: Diagnosis not present

## 2020-09-12 DIAGNOSIS — N183 Chronic kidney disease, stage 3 unspecified: Secondary | ICD-10-CM | POA: Diagnosis not present

## 2020-09-12 DIAGNOSIS — I700 Atherosclerosis of aorta: Secondary | ICD-10-CM | POA: Diagnosis not present

## 2020-09-12 DIAGNOSIS — Z1331 Encounter for screening for depression: Secondary | ICD-10-CM | POA: Diagnosis not present

## 2020-09-12 DIAGNOSIS — I10 Essential (primary) hypertension: Secondary | ICD-10-CM | POA: Diagnosis not present

## 2020-09-12 DIAGNOSIS — R011 Cardiac murmur, unspecified: Secondary | ICD-10-CM | POA: Diagnosis not present

## 2020-09-12 DIAGNOSIS — E559 Vitamin D deficiency, unspecified: Secondary | ICD-10-CM | POA: Diagnosis not present

## 2020-09-12 DIAGNOSIS — M1990 Unspecified osteoarthritis, unspecified site: Secondary | ICD-10-CM | POA: Diagnosis not present

## 2020-09-12 DIAGNOSIS — D126 Benign neoplasm of colon, unspecified: Secondary | ICD-10-CM | POA: Diagnosis not present

## 2020-09-12 DIAGNOSIS — I712 Thoracic aortic aneurysm, without rupture: Secondary | ICD-10-CM | POA: Diagnosis not present

## 2020-09-12 DIAGNOSIS — Z0000 Encounter for general adult medical examination without abnormal findings: Secondary | ICD-10-CM | POA: Diagnosis not present

## 2020-09-12 DIAGNOSIS — Z23 Encounter for immunization: Secondary | ICD-10-CM | POA: Diagnosis not present

## 2020-09-12 DIAGNOSIS — I129 Hypertensive chronic kidney disease with stage 1 through stage 4 chronic kidney disease, or unspecified chronic kidney disease: Secondary | ICD-10-CM | POA: Diagnosis not present

## 2020-09-12 DIAGNOSIS — R82998 Other abnormal findings in urine: Secondary | ICD-10-CM | POA: Diagnosis not present

## 2020-09-12 DIAGNOSIS — R739 Hyperglycemia, unspecified: Secondary | ICD-10-CM | POA: Diagnosis not present

## 2020-09-12 DIAGNOSIS — Z1339 Encounter for screening examination for other mental health and behavioral disorders: Secondary | ICD-10-CM | POA: Diagnosis not present

## 2020-09-12 DIAGNOSIS — E785 Hyperlipidemia, unspecified: Secondary | ICD-10-CM | POA: Diagnosis not present

## 2020-09-13 ENCOUNTER — Other Ambulatory Visit: Payer: Self-pay | Admitting: Internal Medicine

## 2020-09-13 DIAGNOSIS — I712 Thoracic aortic aneurysm, without rupture, unspecified: Secondary | ICD-10-CM

## 2020-09-19 ENCOUNTER — Ambulatory Visit
Admission: RE | Admit: 2020-09-19 | Discharge: 2020-09-19 | Disposition: A | Discharge status: Home / Self Care | Payer: Medicare Other | Source: Ambulatory Visit | Attending: Internal Medicine | Admitting: Internal Medicine

## 2020-09-19 DIAGNOSIS — I2510 Atherosclerotic heart disease of native coronary artery without angina pectoris: Secondary | ICD-10-CM | POA: Diagnosis not present

## 2020-09-19 DIAGNOSIS — I7789 Other specified disorders of arteries and arterioles: Secondary | ICD-10-CM | POA: Diagnosis not present

## 2020-09-19 DIAGNOSIS — I712 Thoracic aortic aneurysm, without rupture, unspecified: Secondary | ICD-10-CM

## 2020-09-19 DIAGNOSIS — J929 Pleural plaque without asbestos: Secondary | ICD-10-CM | POA: Diagnosis not present

## 2020-09-19 MED ORDER — IOPAMIDOL (ISOVUE-370) INJECTION 76%
75.0000 mL | Freq: Once | INTRAVENOUS | Status: AC | PRN
  Administered 2020-09-19: 75 mL via INTRAVENOUS

## 2020-10-02 DIAGNOSIS — H401131 Primary open-angle glaucoma, bilateral, mild stage: Secondary | ICD-10-CM | POA: Diagnosis not present

## 2021-02-05 DIAGNOSIS — H2513 Age-related nuclear cataract, bilateral: Secondary | ICD-10-CM | POA: Diagnosis not present

## 2021-02-05 DIAGNOSIS — H401131 Primary open-angle glaucoma, bilateral, mild stage: Secondary | ICD-10-CM | POA: Diagnosis not present

## 2021-03-12 DIAGNOSIS — D126 Benign neoplasm of colon, unspecified: Secondary | ICD-10-CM | POA: Diagnosis not present

## 2021-03-12 DIAGNOSIS — I700 Atherosclerosis of aorta: Secondary | ICD-10-CM | POA: Diagnosis not present

## 2021-03-12 DIAGNOSIS — I288 Other diseases of pulmonary vessels: Secondary | ICD-10-CM | POA: Diagnosis not present

## 2021-03-12 DIAGNOSIS — E1169 Type 2 diabetes mellitus with other specified complication: Secondary | ICD-10-CM | POA: Diagnosis not present

## 2021-03-12 DIAGNOSIS — N401 Enlarged prostate with lower urinary tract symptoms: Secondary | ICD-10-CM | POA: Diagnosis not present

## 2021-03-12 DIAGNOSIS — E785 Hyperlipidemia, unspecified: Secondary | ICD-10-CM | POA: Diagnosis not present

## 2021-03-12 DIAGNOSIS — I10 Essential (primary) hypertension: Secondary | ICD-10-CM | POA: Diagnosis not present

## 2021-03-12 DIAGNOSIS — M1990 Unspecified osteoarthritis, unspecified site: Secondary | ICD-10-CM | POA: Diagnosis not present

## 2021-03-13 DIAGNOSIS — E1169 Type 2 diabetes mellitus with other specified complication: Secondary | ICD-10-CM | POA: Diagnosis not present

## 2021-03-15 ENCOUNTER — Encounter: Payer: Self-pay | Admitting: Gastroenterology

## 2021-04-03 ENCOUNTER — Other Ambulatory Visit (HOSPITAL_COMMUNITY): Payer: Self-pay | Admitting: Internal Medicine

## 2021-04-03 DIAGNOSIS — I288 Other diseases of pulmonary vessels: Secondary | ICD-10-CM

## 2021-04-20 ENCOUNTER — Other Ambulatory Visit (HOSPITAL_COMMUNITY): Payer: Self-pay | Admitting: Internal Medicine

## 2021-04-20 DIAGNOSIS — I288 Other diseases of pulmonary vessels: Secondary | ICD-10-CM

## 2021-04-26 ENCOUNTER — Ambulatory Visit: Payer: Medicare Other | Admitting: Gastroenterology

## 2021-06-11 DIAGNOSIS — H401131 Primary open-angle glaucoma, bilateral, mild stage: Secondary | ICD-10-CM | POA: Diagnosis not present

## 2021-06-11 DIAGNOSIS — H5213 Myopia, bilateral: Secondary | ICD-10-CM | POA: Diagnosis not present

## 2021-06-11 DIAGNOSIS — H524 Presbyopia: Secondary | ICD-10-CM | POA: Diagnosis not present

## 2021-06-11 DIAGNOSIS — E1165 Type 2 diabetes mellitus with hyperglycemia: Secondary | ICD-10-CM | POA: Diagnosis not present

## 2021-06-11 DIAGNOSIS — H43811 Vitreous degeneration, right eye: Secondary | ICD-10-CM | POA: Diagnosis not present

## 2021-06-11 DIAGNOSIS — H52223 Regular astigmatism, bilateral: Secondary | ICD-10-CM | POA: Diagnosis not present

## 2021-06-11 DIAGNOSIS — H25813 Combined forms of age-related cataract, bilateral: Secondary | ICD-10-CM | POA: Diagnosis not present

## 2021-08-15 DIAGNOSIS — N401 Enlarged prostate with lower urinary tract symptoms: Secondary | ICD-10-CM | POA: Diagnosis not present

## 2021-08-15 DIAGNOSIS — R3912 Poor urinary stream: Secondary | ICD-10-CM | POA: Diagnosis not present

## 2021-08-15 DIAGNOSIS — R972 Elevated prostate specific antigen [PSA]: Secondary | ICD-10-CM | POA: Diagnosis not present

## 2021-09-19 DIAGNOSIS — E785 Hyperlipidemia, unspecified: Secondary | ICD-10-CM | POA: Diagnosis not present

## 2021-09-19 DIAGNOSIS — E559 Vitamin D deficiency, unspecified: Secondary | ICD-10-CM | POA: Diagnosis not present

## 2021-09-19 DIAGNOSIS — Z125 Encounter for screening for malignant neoplasm of prostate: Secondary | ICD-10-CM | POA: Diagnosis not present

## 2021-09-19 DIAGNOSIS — R7989 Other specified abnormal findings of blood chemistry: Secondary | ICD-10-CM | POA: Diagnosis not present

## 2021-09-19 DIAGNOSIS — I10 Essential (primary) hypertension: Secondary | ICD-10-CM | POA: Diagnosis not present

## 2021-09-19 DIAGNOSIS — E1169 Type 2 diabetes mellitus with other specified complication: Secondary | ICD-10-CM | POA: Diagnosis not present

## 2021-09-26 DIAGNOSIS — R82998 Other abnormal findings in urine: Secondary | ICD-10-CM | POA: Diagnosis not present

## 2021-09-26 DIAGNOSIS — E1169 Type 2 diabetes mellitus with other specified complication: Secondary | ICD-10-CM | POA: Diagnosis not present

## 2021-09-26 DIAGNOSIS — I700 Atherosclerosis of aorta: Secondary | ICD-10-CM | POA: Diagnosis not present

## 2021-09-26 DIAGNOSIS — E785 Hyperlipidemia, unspecified: Secondary | ICD-10-CM | POA: Diagnosis not present

## 2021-09-26 DIAGNOSIS — Z0000 Encounter for general adult medical examination without abnormal findings: Secondary | ICD-10-CM | POA: Diagnosis not present

## 2021-09-26 DIAGNOSIS — R3121 Asymptomatic microscopic hematuria: Secondary | ICD-10-CM | POA: Diagnosis not present

## 2021-09-26 DIAGNOSIS — I10 Essential (primary) hypertension: Secondary | ICD-10-CM | POA: Diagnosis not present

## 2021-09-26 DIAGNOSIS — L821 Other seborrheic keratosis: Secondary | ICD-10-CM | POA: Diagnosis not present

## 2021-09-26 DIAGNOSIS — N401 Enlarged prostate with lower urinary tract symptoms: Secondary | ICD-10-CM | POA: Diagnosis not present

## 2021-09-26 DIAGNOSIS — Z1331 Encounter for screening for depression: Secondary | ICD-10-CM | POA: Diagnosis not present

## 2021-09-26 DIAGNOSIS — R12 Heartburn: Secondary | ICD-10-CM | POA: Diagnosis not present

## 2021-09-26 DIAGNOSIS — R809 Proteinuria, unspecified: Secondary | ICD-10-CM | POA: Diagnosis not present

## 2021-09-26 DIAGNOSIS — D126 Benign neoplasm of colon, unspecified: Secondary | ICD-10-CM | POA: Diagnosis not present

## 2021-10-10 DIAGNOSIS — Z1211 Encounter for screening for malignant neoplasm of colon: Secondary | ICD-10-CM | POA: Diagnosis not present

## 2021-10-31 DIAGNOSIS — H401131 Primary open-angle glaucoma, bilateral, mild stage: Secondary | ICD-10-CM | POA: Diagnosis not present

## 2021-10-31 DIAGNOSIS — H25813 Combined forms of age-related cataract, bilateral: Secondary | ICD-10-CM | POA: Diagnosis not present

## 2022-01-09 DIAGNOSIS — Z23 Encounter for immunization: Secondary | ICD-10-CM | POA: Diagnosis not present

## 2022-03-06 DIAGNOSIS — E119 Type 2 diabetes mellitus without complications: Secondary | ICD-10-CM | POA: Diagnosis not present

## 2022-03-06 DIAGNOSIS — H401131 Primary open-angle glaucoma, bilateral, mild stage: Secondary | ICD-10-CM | POA: Diagnosis not present

## 2022-03-06 DIAGNOSIS — H25813 Combined forms of age-related cataract, bilateral: Secondary | ICD-10-CM | POA: Diagnosis not present

## 2022-04-19 DIAGNOSIS — E559 Vitamin D deficiency, unspecified: Secondary | ICD-10-CM | POA: Diagnosis not present

## 2022-04-19 DIAGNOSIS — E785 Hyperlipidemia, unspecified: Secondary | ICD-10-CM | POA: Diagnosis not present

## 2022-04-19 DIAGNOSIS — J302 Other seasonal allergic rhinitis: Secondary | ICD-10-CM | POA: Diagnosis not present

## 2022-04-19 DIAGNOSIS — I700 Atherosclerosis of aorta: Secondary | ICD-10-CM | POA: Diagnosis not present

## 2022-04-19 DIAGNOSIS — R12 Heartburn: Secondary | ICD-10-CM | POA: Diagnosis not present

## 2022-04-19 DIAGNOSIS — L570 Actinic keratosis: Secondary | ICD-10-CM | POA: Diagnosis not present

## 2022-04-19 DIAGNOSIS — N401 Enlarged prostate with lower urinary tract symptoms: Secondary | ICD-10-CM | POA: Diagnosis not present

## 2022-04-19 DIAGNOSIS — I712 Thoracic aortic aneurysm, without rupture, unspecified: Secondary | ICD-10-CM | POA: Diagnosis not present

## 2022-04-19 DIAGNOSIS — M1990 Unspecified osteoarthritis, unspecified site: Secondary | ICD-10-CM | POA: Diagnosis not present

## 2022-04-19 DIAGNOSIS — R809 Proteinuria, unspecified: Secondary | ICD-10-CM | POA: Diagnosis not present

## 2022-04-19 DIAGNOSIS — I10 Essential (primary) hypertension: Secondary | ICD-10-CM | POA: Diagnosis not present

## 2022-04-19 DIAGNOSIS — E1169 Type 2 diabetes mellitus with other specified complication: Secondary | ICD-10-CM | POA: Diagnosis not present

## 2022-07-19 DIAGNOSIS — E119 Type 2 diabetes mellitus without complications: Secondary | ICD-10-CM | POA: Diagnosis not present

## 2022-07-19 DIAGNOSIS — H5213 Myopia, bilateral: Secondary | ICD-10-CM | POA: Diagnosis not present

## 2022-07-19 DIAGNOSIS — H524 Presbyopia: Secondary | ICD-10-CM | POA: Diagnosis not present

## 2022-07-19 DIAGNOSIS — H52223 Regular astigmatism, bilateral: Secondary | ICD-10-CM | POA: Diagnosis not present

## 2022-07-19 DIAGNOSIS — H43811 Vitreous degeneration, right eye: Secondary | ICD-10-CM | POA: Diagnosis not present

## 2022-07-19 DIAGNOSIS — H401131 Primary open-angle glaucoma, bilateral, mild stage: Secondary | ICD-10-CM | POA: Diagnosis not present

## 2022-07-19 DIAGNOSIS — H25813 Combined forms of age-related cataract, bilateral: Secondary | ICD-10-CM | POA: Diagnosis not present

## 2022-07-23 DIAGNOSIS — E1169 Type 2 diabetes mellitus with other specified complication: Secondary | ICD-10-CM | POA: Diagnosis not present

## 2022-08-16 DIAGNOSIS — R972 Elevated prostate specific antigen [PSA]: Secondary | ICD-10-CM | POA: Diagnosis not present

## 2022-08-16 DIAGNOSIS — N401 Enlarged prostate with lower urinary tract symptoms: Secondary | ICD-10-CM | POA: Diagnosis not present

## 2022-08-16 DIAGNOSIS — R3912 Poor urinary stream: Secondary | ICD-10-CM | POA: Diagnosis not present

## 2022-11-06 DIAGNOSIS — E785 Hyperlipidemia, unspecified: Secondary | ICD-10-CM | POA: Diagnosis not present

## 2022-11-06 DIAGNOSIS — I10 Essential (primary) hypertension: Secondary | ICD-10-CM | POA: Diagnosis not present

## 2022-11-06 DIAGNOSIS — E559 Vitamin D deficiency, unspecified: Secondary | ICD-10-CM | POA: Diagnosis not present

## 2022-11-06 DIAGNOSIS — E1169 Type 2 diabetes mellitus with other specified complication: Secondary | ICD-10-CM | POA: Diagnosis not present

## 2022-11-06 DIAGNOSIS — Z125 Encounter for screening for malignant neoplasm of prostate: Secondary | ICD-10-CM | POA: Diagnosis not present

## 2022-11-06 DIAGNOSIS — N401 Enlarged prostate with lower urinary tract symptoms: Secondary | ICD-10-CM | POA: Diagnosis not present

## 2022-11-15 DIAGNOSIS — R3121 Asymptomatic microscopic hematuria: Secondary | ICD-10-CM | POA: Diagnosis not present

## 2022-11-15 DIAGNOSIS — N401 Enlarged prostate with lower urinary tract symptoms: Secondary | ICD-10-CM | POA: Diagnosis not present

## 2022-11-15 DIAGNOSIS — D1800 Hemangioma unspecified site: Secondary | ICD-10-CM | POA: Diagnosis not present

## 2022-11-15 DIAGNOSIS — R809 Proteinuria, unspecified: Secondary | ICD-10-CM | POA: Diagnosis not present

## 2022-11-15 DIAGNOSIS — E785 Hyperlipidemia, unspecified: Secondary | ICD-10-CM | POA: Diagnosis not present

## 2022-11-15 DIAGNOSIS — I10 Essential (primary) hypertension: Secondary | ICD-10-CM | POA: Diagnosis not present

## 2022-11-15 DIAGNOSIS — R011 Cardiac murmur, unspecified: Secondary | ICD-10-CM | POA: Diagnosis not present

## 2022-11-15 DIAGNOSIS — E559 Vitamin D deficiency, unspecified: Secondary | ICD-10-CM | POA: Diagnosis not present

## 2022-11-15 DIAGNOSIS — M1990 Unspecified osteoarthritis, unspecified site: Secondary | ICD-10-CM | POA: Diagnosis not present

## 2022-11-15 DIAGNOSIS — I700 Atherosclerosis of aorta: Secondary | ICD-10-CM | POA: Diagnosis not present

## 2022-11-15 DIAGNOSIS — Z0000 Encounter for general adult medical examination without abnormal findings: Secondary | ICD-10-CM | POA: Diagnosis not present

## 2022-11-15 DIAGNOSIS — E1169 Type 2 diabetes mellitus with other specified complication: Secondary | ICD-10-CM | POA: Diagnosis not present

## 2022-12-30 DIAGNOSIS — H401131 Primary open-angle glaucoma, bilateral, mild stage: Secondary | ICD-10-CM | POA: Diagnosis not present

## 2023-05-02 DIAGNOSIS — E119 Type 2 diabetes mellitus without complications: Secondary | ICD-10-CM | POA: Diagnosis not present

## 2023-05-02 DIAGNOSIS — H401131 Primary open-angle glaucoma, bilateral, mild stage: Secondary | ICD-10-CM | POA: Diagnosis not present

## 2023-05-02 DIAGNOSIS — H43811 Vitreous degeneration, right eye: Secondary | ICD-10-CM | POA: Diagnosis not present

## 2023-05-02 DIAGNOSIS — H25813 Combined forms of age-related cataract, bilateral: Secondary | ICD-10-CM | POA: Diagnosis not present

## 2023-06-06 DIAGNOSIS — E785 Hyperlipidemia, unspecified: Secondary | ICD-10-CM | POA: Diagnosis not present

## 2023-06-06 DIAGNOSIS — K4090 Unilateral inguinal hernia, without obstruction or gangrene, not specified as recurrent: Secondary | ICD-10-CM | POA: Diagnosis not present

## 2023-06-06 DIAGNOSIS — R809 Proteinuria, unspecified: Secondary | ICD-10-CM | POA: Diagnosis not present

## 2023-06-06 DIAGNOSIS — D126 Benign neoplasm of colon, unspecified: Secondary | ICD-10-CM | POA: Diagnosis not present

## 2023-06-06 DIAGNOSIS — E559 Vitamin D deficiency, unspecified: Secondary | ICD-10-CM | POA: Diagnosis not present

## 2023-06-06 DIAGNOSIS — L570 Actinic keratosis: Secondary | ICD-10-CM | POA: Diagnosis not present

## 2023-06-06 DIAGNOSIS — I10 Essential (primary) hypertension: Secondary | ICD-10-CM | POA: Diagnosis not present

## 2023-06-06 DIAGNOSIS — N401 Enlarged prostate with lower urinary tract symptoms: Secondary | ICD-10-CM | POA: Diagnosis not present

## 2023-06-06 DIAGNOSIS — I700 Atherosclerosis of aorta: Secondary | ICD-10-CM | POA: Diagnosis not present

## 2023-06-06 DIAGNOSIS — J302 Other seasonal allergic rhinitis: Secondary | ICD-10-CM | POA: Diagnosis not present

## 2023-06-06 DIAGNOSIS — M1990 Unspecified osteoarthritis, unspecified site: Secondary | ICD-10-CM | POA: Diagnosis not present

## 2023-06-06 DIAGNOSIS — E1169 Type 2 diabetes mellitus with other specified complication: Secondary | ICD-10-CM | POA: Diagnosis not present

## 2023-06-27 DIAGNOSIS — I10 Essential (primary) hypertension: Secondary | ICD-10-CM | POA: Diagnosis not present

## 2023-06-27 DIAGNOSIS — E1165 Type 2 diabetes mellitus with hyperglycemia: Secondary | ICD-10-CM | POA: Diagnosis not present

## 2023-08-08 DIAGNOSIS — R809 Proteinuria, unspecified: Secondary | ICD-10-CM | POA: Diagnosis not present

## 2023-08-08 DIAGNOSIS — I10 Essential (primary) hypertension: Secondary | ICD-10-CM | POA: Diagnosis not present

## 2023-09-03 DIAGNOSIS — E119 Type 2 diabetes mellitus without complications: Secondary | ICD-10-CM | POA: Diagnosis not present

## 2023-09-03 DIAGNOSIS — H35373 Puckering of macula, bilateral: Secondary | ICD-10-CM | POA: Diagnosis not present

## 2023-09-03 DIAGNOSIS — H401131 Primary open-angle glaucoma, bilateral, mild stage: Secondary | ICD-10-CM | POA: Diagnosis not present

## 2023-09-03 DIAGNOSIS — H25813 Combined forms of age-related cataract, bilateral: Secondary | ICD-10-CM | POA: Diagnosis not present

## 2023-09-24 DIAGNOSIS — I10 Essential (primary) hypertension: Secondary | ICD-10-CM | POA: Diagnosis not present

## 2023-09-24 DIAGNOSIS — E785 Hyperlipidemia, unspecified: Secondary | ICD-10-CM | POA: Diagnosis not present

## 2023-09-24 DIAGNOSIS — E1169 Type 2 diabetes mellitus with other specified complication: Secondary | ICD-10-CM | POA: Diagnosis not present

## 2023-10-01 DIAGNOSIS — Z23 Encounter for immunization: Secondary | ICD-10-CM | POA: Diagnosis not present
